# Patient Record
Sex: Female | Born: 1995 | Race: White | Hispanic: No | Marital: Single | Smoking: Never smoker
Health system: Southern US, Community
[De-identification: ages and names within clinical notes are randomized; demographics above are authoritative.]

## PROBLEM LIST (undated history)

## (undated) DIAGNOSIS — K219 Gastro-esophageal reflux disease without esophagitis: Secondary | ICD-10-CM

## (undated) DIAGNOSIS — E559 Vitamin D deficiency, unspecified: Secondary | ICD-10-CM

## (undated) DIAGNOSIS — N946 Dysmenorrhea, unspecified: Secondary | ICD-10-CM

## (undated) DIAGNOSIS — N1330 Unspecified hydronephrosis: Secondary | ICD-10-CM

## (undated) DIAGNOSIS — R519 Headache, unspecified: Secondary | ICD-10-CM

## (undated) DIAGNOSIS — M25539 Pain in unspecified wrist: Secondary | ICD-10-CM

## (undated) DIAGNOSIS — R109 Unspecified abdominal pain: Secondary | ICD-10-CM

## (undated) DIAGNOSIS — IMO0001 Reserved for inherently not codable concepts without codable children: Secondary | ICD-10-CM

## (undated) DIAGNOSIS — E785 Hyperlipidemia, unspecified: Secondary | ICD-10-CM

## (undated) DIAGNOSIS — R55 Syncope and collapse: Secondary | ICD-10-CM

## (undated) DIAGNOSIS — J45909 Unspecified asthma, uncomplicated: Secondary | ICD-10-CM

## (undated) DIAGNOSIS — L309 Dermatitis, unspecified: Secondary | ICD-10-CM

## (undated) DIAGNOSIS — L603 Nail dystrophy: Secondary | ICD-10-CM

## (undated) DIAGNOSIS — R81 Glycosuria: Secondary | ICD-10-CM

## (undated) DIAGNOSIS — J309 Allergic rhinitis, unspecified: Secondary | ICD-10-CM

## (undated) DIAGNOSIS — R51 Headache: Secondary | ICD-10-CM

## (undated) HISTORY — DX: Unspecified abdominal pain: R10.9

## (undated) HISTORY — DX: Headache, unspecified: R51.9

## (undated) HISTORY — DX: Dermatitis, unspecified: L30.9

## (undated) HISTORY — DX: Pain in unspecified wrist: M25.539

## (undated) HISTORY — DX: Allergic rhinitis, unspecified: J30.9

## (undated) HISTORY — PX: DIAGNOSTIC LAPAROSCOPY: SUR761

## (undated) HISTORY — DX: Dysmenorrhea, unspecified: N94.6

## (undated) HISTORY — DX: Hyperlipidemia, unspecified: E78.5

## (undated) HISTORY — DX: Unspecified asthma, uncomplicated: J45.909

## (undated) HISTORY — DX: Headache: R51

## (undated) HISTORY — DX: Nail dystrophy: L60.3

## (undated) HISTORY — DX: Reserved for inherently not codable concepts without codable children: IMO0001

## (undated) HISTORY — DX: Unspecified hydronephrosis: N13.30

## (undated) HISTORY — DX: Syncope and collapse: R55

## (undated) HISTORY — DX: Vitamin D deficiency, unspecified: E55.9

## (undated) HISTORY — DX: Gastro-esophageal reflux disease without esophagitis: K21.9

## (undated) HISTORY — PX: OTHER SURGICAL HISTORY: SHX169

---
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
## 2016-01-11 ENCOUNTER — Ambulatory Visit: Payer: Medicaid Other | Admitting: Obstetrics and Gynecology

## 2016-01-22 ENCOUNTER — Encounter: Payer: Self-pay | Admitting: Obstetrics and Gynecology

## 2016-01-22 ENCOUNTER — Ambulatory Visit (INDEPENDENT_AMBULATORY_CARE_PROVIDER_SITE_OTHER): Payer: Medicaid Other | Admitting: Obstetrics and Gynecology

## 2016-01-22 VITALS — BP 112/71 | HR 92 | Resp 16 | Ht 62.0 in | Wt 114.9 lb

## 2016-01-22 DIAGNOSIS — N390 Urinary tract infection, site not specified: Secondary | ICD-10-CM

## 2016-01-22 NOTE — Progress Notes (Signed)
9:34 AM   Robin Mccall 1996-04-25 [id number]  Referring provider: Evelene Croon, MD [address] [place], [place] [address]  Chief Complaint  Patient presents with  . Recurrent UTI    HPI: Patient is an 20 year old female presenting today as a referral from her primary care provider's office for recurrent urinary tract infections and glycosuria.    She states that she was recently seen by her GYN provider and was diagnosed with a vaginal yeast infection. She has since completed treatment with improvement in dysuria symptoms. She denies any flank pain, fevers, gross hematuria, frequency or urgency. He has no history of kidney stones.  Patient states that she has been told that of her recent urine dips have been positive for glucose. She states that her primary care provider performed blood work and told her that she was not diabetic but that she was unsure why she had glucose in her urine.  She does report discomfort with intercourse. She states that she did not have any of these symptoms prior to beginning to have intercourse. She reports vaginal irritation and dryness. She states that it may be related to condom usage.  Daily fluid intake includes one soda, 1-2 glasses of milk and one bottle of water daily.   10/23/15 Urine Culture- no growth Urine dip positive for +2RBCs, small glucose, trace protein and trace ketones  Abdominal US negative for GU pathology 10/04/16.   Interval History 01/22/16 Reports intermittent dysuria.  No gross hematuria.  She has been drinking more water. She was seen by nephrology and has a follow up scheduled.  She reports that she is undergoing testing for her heart by her PCP and that they are concerned about her kidneys.  She states that she should be notified of results in 3 weeks.    PMH: Past Medical History  Diagnosis Date  . Asthma   . Allergic rhinitis   . Dermatitis   . Dysmenorrhea   . Reflux   . HLD (hyperlipidemia)   . Wrist pain   .  Syncope   . Abdominal pain   . Vitamin D deficiency   . Hydronephrosis   . 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 HA (headache)     migraines-related to stress  . Glucose found in urine on examination     seeing nephrologist on 12-07-15-pt unsure of what the MD's name is     Surgical History: Past Surgical History  Procedure Laterality Date  . Laporoscopic abdomen    . Diagnostic laparoscopy    . Ganglion cyst excision Left 12/14/2015    Procedure: REMOVAL GANGLION OF WRIST;  Surgeon: Deeann Saint, MD;  Location: [facility] ORS;  Service: Orthopedics;  Laterality: Left;    Home Medications:    Medication List       This list is accurate as of: 01/22/16  9:34 AM.  Always use your most recent med list.               albuterol 108 (90 Base) MCG/ACT inhaler  Commonly known as:  PROVENTIL HFA;VENTOLIN HFA  Inhale 2 puffs into the lungs every 6 (six) hours as needed for wheezing or shortness of breath.     dicyclomine 10 MG capsule  Commonly known as:  BENTYL  Take 10 mg by mouth as needed.     multivitamin with minerals tablet  Take 1 tablet by mouth 3 (three) times daily. Reported on 01/22/2016     NIGHTTIME SLEEP AID PO  Take 1 tablet by mouth at  bedtime as needed.     TRI-SPRINTEC 0.18/0.215/0.25 MG-35 MCG tablet  Generic drug:  Norgestimate-Ethinyl Estradiol Triphasic  Take 1 tablet by mouth daily. Reported on 11/11/2015        Allergies: No Known Allergies  Family History: Family History  Problem Relation Age of Onset  . Stroke    . Heart disease    . Cancer      Social History:  reports that she has never smoked. She does not have any smokeless tobacco history on file. She reports that she does not drink alcohol or use illicit drugs.  ROS: UROLOGY Frequent Urination?: No Hard to postpone urination?: No Burning/pain with urination?: Yes Get up at night to urinate?: No Leakage of urine?: No Urine stream starts and stops?: No Trouble starting stream?: No Do you have  to strain to urinate?: No Blood in urine?: No Urinary tract infection?: Yes Sexually transmitted disease?: No Injury to kidneys or bladder?: No Painful intercourse?: Yes Weak stream?: No Currently pregnant?: No Vaginal bleeding?: No Last menstrual period?: n  Gastrointestinal Nausea?: No Vomiting?: No Indigestion/heartburn?: No Diarrhea?: No Constipation?: No  Constitutional Fever: No Night sweats?: No Weight loss?: No Fatigue?: No  Skin Skin rash/lesions?: No Itching?: No  Eyes Blurred vision?: No Double vision?: No  Ears/Nose/Throat Sore throat?: No Sinus problems?: No  Hematologic/Lymphatic Swollen glands?: No Easy bruising?: No  Cardiovascular Leg swelling?: No Chest pain?: No  Respiratory Cough?: No Shortness of breath?: No  Endocrine Excessive thirst?: No  Musculoskeletal Back pain?: Yes Joint pain?: No  Neurological Headaches?: No Dizziness?: No  Psychologic Depression?: No Anxiety?: No  Physical Exam: BP 112/71 mmHg  Pulse 92  Resp 16  Ht 5\' 2"  (1.575 m)  Wt 114 lb 14.4 oz (52.118 kg)  BMI 21.01 kg/m2  Constitutional:  Alert and oriented, No acute distress. HEENT: [place] AT, moist mucus membranes.  Trachea midline, no masses. GU: No CVA tenderness.  Skin: No rashes, bruises or suspicious lesions. Neurologic: Grossly intact, no focal deficits, moving all 4 extremities. Psychiatric: Normal mood and affect.  Laboratory Data:   Urinalysis Results for orders placed or performed during the hospital encounter of 12/14/15  Pregnancy, urine POC  Result Value Ref Range   Preg Test, Ur Negative Negative  Pregnancy, urine POC  Result Value Ref Range   Preg Test, Ur NEGATIVE NEGATIVE    Pertinent Imaging:   Assessment & Plan:    1. Recurrent UTI- Continued intermittent dysuria. - GC/Chlamydia/Trichomonas- negative - Urinalysis, Complete  2. Microscopic Hematuria- 3-10 RBCs seen today.  Sent for culture to r/o infection as  cause.  Negative abdominal US 09/2015.  Will further assess once work up complete by PCP and nephrology.  May need cystoscopy in the future.   2. Dysparuenia- Recommended hypoallergenic vaginal lubricants and increased hydration.   3. Glycosuria- Followed by [facility].  Return in about 3 weeks (around 02/12/2016) for recheck [facility] with [facility].  These notes generated with voice recognition software. I apologize for typographical errors.  Earlie LouLindsay Rorey Bisson, FNP  [facility] [address], [address] [place], [place] [address] [phone]

## 2016-01-23 LAB — MICROSCOPIC EXAMINATION

## 2016-01-23 LAB — URINALYSIS, COMPLETE
BILIRUBIN UA: NEGATIVE
KETONES UA: NEGATIVE
Leukocytes, UA: NEGATIVE
NITRITE UA: NEGATIVE
Protein, UA: NEGATIVE
SPEC GRAV UA: 1.025 (ref 1.005–1.030)
UUROB: 0.2 mg/dL (ref 0.2–1.0)
pH, UA: 5.5 (ref 5.0–7.5)

## 2016-01-24 LAB — CULTURE, URINE COMPREHENSIVE

## 2016-02-12 ENCOUNTER — Ambulatory Visit (INDEPENDENT_AMBULATORY_CARE_PROVIDER_SITE_OTHER): Payer: Medicaid Other | Admitting: Urology

## 2016-02-12 ENCOUNTER — Encounter: Payer: Self-pay | Admitting: Urology

## 2016-02-12 VITALS — BP 97/64 | HR 82 | Ht 63.0 in | Wt 114.6 lb

## 2016-02-12 DIAGNOSIS — N390 Urinary tract infection, site not specified: Secondary | ICD-10-CM | POA: Diagnosis not present

## 2016-02-12 DIAGNOSIS — R3129 Other microscopic hematuria: Secondary | ICD-10-CM | POA: Insufficient documentation

## 2016-02-12 DIAGNOSIS — R81 Glycosuria: Secondary | ICD-10-CM | POA: Insufficient documentation

## 2016-02-12 DIAGNOSIS — N941 Unspecified dyspareunia: Secondary | ICD-10-CM

## 2016-02-12 LAB — URINALYSIS, COMPLETE
Bilirubin, UA: NEGATIVE
GLUCOSE, UA: NEGATIVE
Ketones, UA: NEGATIVE
Leukocytes, UA: NEGATIVE
NITRITE UA: NEGATIVE
PH UA: 5.5 (ref 5.0–7.5)
Protein, UA: NEGATIVE
Specific Gravity, UA: 1.025 (ref 1.005–1.030)
Urobilinogen, Ur: 0.2 mg/dL (ref 0.2–1.0)

## 2016-02-12 LAB — MICROSCOPIC EXAMINATION
Epithelial Cells (non renal): >10 /hpf — AB (ref 0–10)
WBC, UA: NONE SEEN /hpf (ref 0–?)

## 2016-02-12 NOTE — Progress Notes (Signed)
02/12/2016 10:19 PM   Robin Mccall 1996-08-02 [id number]  Referring provider: Evelene Croon, MD [place] [address] [place], [place] [address]  Chief Complaint  Patient presents with  . Recurrent UTI    3 week follow up    HPI: Patient is an 20 year old Caucasian female who is currently being worked up for glucose dysuria and microscopic hematuria with nephrology and her primary care physician.  Patient states that she suffers dyspareunia, recurrent urinary tract infections and dysuria.  I do not have any documented infections.  Renal ultrasound performed on 10/05/2015 noted no abnormalities. Her UA today is negative for glucose, but has 3-10 rbc's per high-power field.  She experiences pain during intercourse upon penetration and thrusting.  Her GC/chlamydia antra cultures have been negative.  Urine culture performed on 01/22/2016 was negative for infection.     PMH: Past Medical History  Diagnosis Date  . Asthma   . Allergic rhinitis   . Dermatitis   . Dysmenorrhea   . Reflux   . HLD (hyperlipidemia)   . Wrist pain   . Syncope   . Abdominal pain   . Vitamin D deficiency   . Hydronephrosis   . 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 HA (headache)     migraines-related to stress  . Glucose found in urine on examination     seeing nephrologist on 12-07-15-pt unsure of what the MD's name is     Surgical History: Past Surgical History  Procedure Laterality Date  . Laporoscopic abdomen    . Diagnostic laparoscopy    . Ganglion cyst excision Left 12/14/2015    Procedure: REMOVAL GANGLION OF WRIST;  Surgeon: Deeann Saint, MD;  Location: [facility] ORS;  Service: Orthopedics;  Laterality: Left;    Home Medications:    Medication List       This list is accurate as of: 02/12/16 10:19 PM.  Always use your most recent med list.               albuterol 108 (90 Base) MCG/ACT inhaler  Commonly known as:  PROVENTIL HFA;VENTOLIN HFA  Inhale 2 puffs into the lungs every 6 (six) hours as  needed for wheezing or shortness of breath.     CLARITIN 10 MG tablet  Generic drug:  loratadine  Take 10 mg by mouth daily.     dicyclomine 10 MG capsule  Commonly known as:  BENTYL  Take 10 mg by mouth as needed.     multivitamin with minerals tablet  Take 1 tablet by mouth 3 (three) times daily. Reported on 02/12/2016     NIGHTTIME SLEEP AID PO  Take 1 tablet by mouth at bedtime as needed.     TRI-SPRINTEC 0.18/0.215/0.25 MG-35 MCG tablet  Generic drug:  Norgestimate-Ethinyl Estradiol Triphasic  Take 1 tablet by mouth daily. Reported on 11/11/2015        Allergies: No Known Allergies  Family History: Family History  Problem Relation Age of Onset  . Stroke    . Heart disease    . Cancer    . Kidney disease Neg Hx   . Bladder Cancer Neg Hx     Social History:  reports that she has never smoked. She does not have any smokeless tobacco history on file. She reports that she does not drink alcohol or use illicit drugs.  ROS: UROLOGY Frequent Urination?: No Hard to postpone urination?: No Burning/pain with urination?: Yes Get up at night to urinate?: Yes Leakage of urine?: No Urine stream starts  and stops?: No Trouble starting stream?: No Do you have to strain to urinate?: No Blood in urine?: No Urinary tract infection?: Yes Sexually transmitted disease?: No Injury to kidneys or bladder?: No Painful intercourse?: Yes Weak stream?: No Currently pregnant?: No Vaginal bleeding?: No Last menstrual period?: n  Gastrointestinal Nausea?: No Vomiting?: No Indigestion/heartburn?: No Diarrhea?: No Constipation?: No  Constitutional Fever: No Night sweats?: No Weight loss?: No Fatigue?: No  Skin Skin rash/lesions?: No Itching?: No  Eyes Blurred vision?: No Double vision?: No  Ears/Nose/Throat Sore throat?: No Sinus problems?: No  Hematologic/Lymphatic Swollen glands?: No Easy bruising?: No  Cardiovascular Leg swelling?: No Chest pain?:  No  Respiratory Cough?: No Shortness of breath?: No  Endocrine Excessive thirst?: No  Musculoskeletal Back pain?: No Joint pain?: No  Neurological Headaches?: No Dizziness?: No  Psychologic Depression?: No Anxiety?: No  Physical Exam: BP 97/64 mmHg  Pulse 82  Ht 5\' 3"  (1.6 m)  Wt 114 lb 9.6 oz (51.982 kg)  BMI 20.31 kg/m2  LMP 01/20/2016  Constitutional: Well nourished. Alert and oriented, No acute distress. HEENT: [place] AT, moist mucus membranes. Trachea midline, no masses. Cardiovascular: No clubbing, cyanosis, or edema. Respiratory: Normal respiratory effort, no increased work of breathing. Skin: No rashes, bruises or suspicious lesions. Lymph: No cervical or inguinal adenopathy. Neurologic: Grossly intact, no focal deficits, moving all 4 extremities. Psychiatric: Normal mood and affect.  Laboratory Data: Lab Results  Component Value Date   WBC 4.9 09/16/2014   HGB 13.5 09/16/2014   HCT 40.2 09/16/2014   MCV 89 09/16/2014   PLT 219 09/16/2014    Lab Results  Component Value Date   CREATININE 0.70 09/16/2014    Lab Results  Component Value Date   AST 21 09/16/2014   Lab Results  Component Value Date   ALT 18 09/16/2014     Urinalysis Results for orders placed or performed in visit on 02/12/16  Microscopic Examination  Result Value Ref Range   WBC, UA None seen 0 -  5 /hpf   RBC, UA 3-10 (A) 0 -  2 /hpf   Epithelial Cells (non renal) >10 (A) 0 - 10 /hpf   Bacteria, UA Few None seen/Few  Urinalysis, Complete  Result Value Ref Range   Specific Gravity, UA 1.025 1.005 - 1.030   pH, UA 5.5 5.0 - 7.5   Color, UA Yellow Yellow   Appearance Ur Cloudy (A) Clear   Leukocytes, UA Negative Negative   Protein, UA Negative Negative/Trace   Glucose, UA Negative Negative   Ketones, UA Negative Negative   RBC, UA 1+ (A) Negative   Bilirubin, UA Negative Negative   Urobilinogen, Ur 0.2 0.2 - 1.0 mg/dL   Nitrite, UA Negative Negative   Microscopic  Examination See below:     Pertinent Imaging: CLINICAL DATA: Abdominal pain.  EXAM: ULTRASOUND ABDOMEN COMPLETE  COMPARISON: No prior exams .  FINDINGS: Gallbladder: Gallbladder is mildly contracted. No gallstones or wall thickening visualized. No sonographic Murphy sign noted.  Common bile duct: Diameter: 1.7 mm.  Liver: No focal lesion identified. Within normal limits in parenchymal echogenicity.  IVC: No abnormality visualized.  Pancreas: Visualized portion unremarkable.  Spleen: Size and appearance within normal limits.  Right Kidney: Length: 9.9 cm. Echogenicity within normal limits. No mass or hydronephrosis visualized.  Left Kidney: Length: 10.5 cm. Echogenicity within normal limits. No mass or hydronephrosis visualized.  Abdominal aorta: No aneurysm visualized.  Other findings: None.  IMPRESSION: Negative exam.   Electronically Signed  ByMaisie Fus Register  On: 10/05/2015 07:58        Assessment & Plan:   1. Recurrent UTI:   At this time, we do not have documented infections.  We'll continue to monitor.  - Urinalysis, Complete  2. Glucosuria:   Currently being evaluated by nephrology and PCP.  3. Dyspareunia:  Patient needed to cut office visit short due to having to take a test. She will return once her PCP has finished their evaluation.  4. Microscopic hematuria:   Patient needed to cut office visit short due to having to take a test. She will return once her PCP has finished their evaluation.   Return for Return once receive PCPs notes.  These notes generated with voice recognition software. I apologize for typographical errors.  Michiel Cowboy, PA-C  [facility] [address] [place], [place] [address] [phone]
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
## 2017-02-04 ENCOUNTER — Emergency Department
Admission: EM | Admit: 2017-02-04 | Discharge: 2017-02-04 | Disposition: A | Discharge status: Home / Self Care | Payer: Medicaid Other | Attending: Emergency Medicine | Admitting: Emergency Medicine

## 2017-02-04 ENCOUNTER — Encounter: Payer: Self-pay | Admitting: Emergency Medicine

## 2017-02-04 DIAGNOSIS — L03031 Cellulitis of right toe: Secondary | ICD-10-CM | POA: Insufficient documentation

## 2017-02-04 DIAGNOSIS — J45909 Unspecified asthma, uncomplicated: Secondary | ICD-10-CM | POA: Insufficient documentation

## 2017-02-04 MED ORDER — CEPHALEXIN 500 MG PO CAPS: 500.0000 mg | ORAL_CAPSULE | Freq: Four times a day (QID) | ORAL | 0 refills | Status: AC

## 2017-02-04 NOTE — ED Notes (Signed)
See triage note  States she wore a new pain of shoe about 5 days ago  Now has swelling and blister area to right great toe
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
## 2017-02-04 NOTE — ED Triage Notes (Signed)
C/O left great toe pain and swelling x 5 days.

## 2017-03-28 ENCOUNTER — Ambulatory Visit (INDEPENDENT_AMBULATORY_CARE_PROVIDER_SITE_OTHER): Payer: 59 | Admitting: Obstetrics and Gynecology

## 2017-03-28 ENCOUNTER — Encounter: Payer: Self-pay | Admitting: Obstetrics and Gynecology

## 2017-03-28 VITALS — BP 108/64 | Ht 63.0 in | Wt 115.0 lb

## 2017-03-28 DIAGNOSIS — N3001 Acute cystitis with hematuria: Secondary | ICD-10-CM | POA: Diagnosis not present

## 2017-03-28 LAB — POCT URINALYSIS DIPSTICK
BILIRUBIN UA: NEGATIVE
GLUCOSE UA: NEGATIVE
Ketones, UA: NEGATIVE
Nitrite, UA: NEGATIVE
PH UA: 6 (ref 5.0–8.0)
Spec Grav, UA: 1.015 (ref 1.010–1.025)

## 2017-03-28 MED ORDER — NITROFURANTOIN MONOHYD MACRO 100 MG PO CAPS: 100.0000 mg | ORAL_CAPSULE | Freq: Two times a day (BID) | ORAL | 0 refills | Status: AC

## 2017-03-28 NOTE — Progress Notes (Signed)
Chief Complaint  Patient presents with  . Urinary Tract Infection    HPI:      [name] is a 21 y.o. G0P0000 who LMP was Patient's last menstrual period was 03/22/2017., presents today for UTI sx. She has noticed dysuria, hematuria, urinary frequency, urgency for the past 3 days. No LBP, fevers, vag sx. She has a hx of recurrent UTIs and saw urology in 2017 without etiology determined. She hasn't had a UTI recently.      Patient Active Problem List   Diagnosis Date Noted  . Microscopic hematuria 02/12/2016  . 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 Dyspareunia in female 02/12/2016  . 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 Stroke Unknown   . Heart disease Unknown   . Cancer Unknown   . Kidney disease Neg Hx   . Bladder Cancer Neg Hx     Social History   Social History  . Marital status: Single    Spouse name: N/A  . Number of children: N/A  . Years of education: N/A   Occupational History  . Not on file.   Social History Main Topics  . Smoking status: Never Smoker  . Smokeless tobacco: Never Used  . Alcohol use No  . Drug use: No  . Sexual activity: Yes    Birth control/ protection: Pill   Other Topics Concern  . Not on file   Social History Narrative  . No narrative on file     Current Outpatient Prescriptions:  .  Norgestimate-Ethinyl Estradiol Triphasic (TRI-SPRINTEC) 0.18/0.215/0.25 MG-35 MCG tablet, Take 1 tablet by mouth daily. Reported on 11/11/2015, Disp: , Rfl:  .  albuterol (PROVENTIL HFA;VENTOLIN HFA) 108 (90 Base) MCG/ACT inhaler, Inhale 2 puffs into the lungs every 6 (six) hours as needed for wheezing or shortness of breath., Disp: , Rfl:  .  DiphenhydrAMINE HCl, Sleep, (NIGHTTIME SLEEP AID PO), Take 1 tablet by mouth at bedtime as needed., Disp: , Rfl:  .  nitrofurantoin, macrocrystal-monohydrate, (MACROBID) 100 MG capsule, Take 1 capsule (100 mg total) by mouth 2 (two) times daily., Disp: 10 capsule, Rfl:  0  Review of Systems  Constitutional: Negative for fever.  Gastrointestinal: Negative for blood in stool, constipation, diarrhea, nausea and vomiting.  Genitourinary: Positive for dysuria, frequency, hematuria and urgency. Negative for dyspareunia, flank pain, vaginal bleeding, vaginal discharge and vaginal pain.  Musculoskeletal: Negative for back pain.  Skin: Negative for rash.     OBJECTIVE:   Vitals:  BP 108/64   Ht 5\' 3"  (1.6 m)   Wt 115 lb (52.2 kg)   LMP 03/22/2017   BMI 20.37 kg/m   Physical Exam  Constitutional: She is well-developed, well-nourished, and in no distress.  Abdominal: There is no CVA tenderness.  Vitals reviewed.   Results: Results for orders placed or performed in visit on 03/28/17 (from the past 24 hour(s))  POCT Urinalysis Dipstick     Status: Abnormal   Collection Time: 03/28/17 12:06 PM  Result Value Ref Range   Color, UA yellow    Clarity, UA     Glucose, UA neg    Bilirubin, UA neg    Ketones, UA neg    Spec Grav, UA 1.015 1.010 - 1.025   Blood, UA mod    pH, UA 6.0 5.0 - 8.0   Protein, UA 2+    Urobilinogen, UA  0.2 or 1.0 E.U./dL   Nitrite, UA neg    Leukocytes, UA  Trace (A) Negative     Assessment/Plan: Acute cystitis with hematuria - Rx macrobid. F/u prn.  - Plan: POCT Urinalysis Dipstick, Urine culture, nitrofurantoin, macrocrystal-monohydrate, (MACROBID) 100 MG capsule     Meds ordered this encounter  Medications  . nitrofurantoin, macrocrystal-monohydrate, (MACROBID) 100 MG capsule    Sig: Take 1 capsule (100 mg total) by mouth 2 (two) times daily.    Dispense:  10 capsule    Refill:  0      Return if symptoms worsen or fail to improve.  Alicia B. Copland, PA-C 03/28/2017 12:07 PM

## 2017-03-30 LAB — URINE CULTURE

## 2018-03-18 ENCOUNTER — Encounter: Payer: Self-pay | Admitting: Emergency Medicine

## 2018-03-18 ENCOUNTER — Emergency Department
Admission: EM | Admit: 2018-03-18 | Discharge: 2018-03-18 | Disposition: A | Discharge status: Home / Self Care | Payer: 59 | Attending: Emergency Medicine | Admitting: Emergency Medicine

## 2018-03-18 ENCOUNTER — Other Ambulatory Visit: Payer: Self-pay

## 2018-03-18 DIAGNOSIS — R1013 Epigastric pain: Secondary | ICD-10-CM

## 2018-03-18 DIAGNOSIS — Z79899 Other long term (current) drug therapy: Secondary | ICD-10-CM | POA: Diagnosis not present

## 2018-03-18 DIAGNOSIS — J45909 Unspecified asthma, uncomplicated: Secondary | ICD-10-CM | POA: Insufficient documentation

## 2018-03-18 DIAGNOSIS — G43809 Other migraine, not intractable, without status migrainosus: Secondary | ICD-10-CM

## 2018-03-18 DIAGNOSIS — R112 Nausea with vomiting, unspecified: Secondary | ICD-10-CM | POA: Insufficient documentation

## 2018-03-18 LAB — COMPREHENSIVE METABOLIC PANEL
ALBUMIN: 3.7 g/dL (ref 3.5–5.0)
ALT: 17 U/L (ref 14–54)
AST: 22 U/L (ref 15–41)
Alkaline Phosphatase: 63 U/L (ref 38–126)
Anion gap: 5 (ref 5–15)
BILIRUBIN TOTAL: 0.2 mg/dL — AB (ref 0.3–1.2)
BUN: 13 mg/dL (ref 6–20)
CHLORIDE: 106 mmol/L (ref 101–111)
CO2: 25 mmol/L (ref 22–32)
CREATININE: 0.45 mg/dL (ref 0.44–1.00)
Calcium: 8.3 mg/dL — ABNORMAL LOW (ref 8.9–10.3)
GFR calc Af Amer: >60 mL/min (ref >60)
GFR calc non Af Amer: >60 mL/min (ref >60)
Glucose, Bld: 126 mg/dL — ABNORMAL HIGH (ref 65–99)
POTASSIUM: 3.4 mmol/L — AB (ref 3.5–5.1)
Sodium: 136 mmol/L (ref 135–145)
TOTAL PROTEIN: 7 g/dL (ref 6.5–8.1)

## 2018-03-18 LAB — CBC
HEMATOCRIT: 40.2 % (ref 35.0–47.0)
Hemoglobin: 13.8 g/dL (ref 12.0–16.0)
MCH: 30.1 pg (ref 26.0–34.0)
MCHC: 34.3 g/dL (ref 32.0–36.0)
MCV: 87.8 fL (ref 80.0–100.0)
Platelets: 250 10*3/uL (ref 150–440)
RBC: 4.58 MIL/uL (ref 3.80–5.20)
RDW: 13.1 % (ref 11.5–14.5)
WBC: 5.9 10*3/uL (ref 3.6–11.0)

## 2018-03-18 LAB — HCG, QUANTITATIVE, PREGNANCY: hCG, Beta Chain, Quant, S: <1 m[IU]/mL (ref <5)

## 2018-03-18 LAB — LIPASE, BLOOD: Lipase: 32 U/L (ref 11–51)

## 2018-03-18 MED ORDER — PROCHLORPERAZINE EDISYLATE 10 MG/2ML IJ SOLN
10.0000 mg | Freq: Once | INTRAMUSCULAR | Status: AC
  Administered 2018-03-18: 10 mg via INTRAVENOUS
  Filled 2018-03-18: qty 2

## 2018-03-18 MED ORDER — DIPHENHYDRAMINE HCL 50 MG/ML IJ SOLN
25.0000 mg | Freq: Once | INTRAMUSCULAR | Status: AC
  Administered 2018-03-18: 25 mg via INTRAVENOUS
  Filled 2018-03-18: qty 1

## 2018-03-18 MED ORDER — SODIUM CHLORIDE 0.9 % IV BOLUS
1000.0000 mL | Freq: Once | INTRAVENOUS | Status: AC
  Administered 2018-03-18: 1000 mL via INTRAVENOUS

## 2018-03-18 MED ORDER — ONDANSETRON HCL 4 MG/2ML IJ SOLN: 4.0000 mg | Freq: Once | INTRAMUSCULAR | Status: DC | PRN

## 2018-03-18 NOTE — ED Triage Notes (Addendum)
Pt reports upper abd pain sing Friday night with vomiting since Saturday morning; pt says after eating or drinking anything she vomits; tonight she started with a migraine to frontal area; pt also reports "brown" when she wipes after voiding;

## 2018-03-18 NOTE — ED Notes (Signed)

## 2018-03-18 NOTE — ED Provider Notes (Signed)
[facility] Emergency Department Provider Note  ____________________________________________   First MD Initiated Contact with Patient 03/18/18 0136     (approximate)  I have reviewed the triage vital signs and the nursing notes.   HISTORY  Chief Complaint Migraine and Abdominal Pain   HPI Robin Mccall is a 22 y.o. female who self presents to the emergency department with about 2 days of diffuse cramping epigastric pain associated with nausea and vomiting.  No diarrhea.  No fevers or chills.  No dysuria frequency or hesitancy.  No back pain.  Pain is moderate severity cramping mostly constant.  Seems to be somewhat worse when trying to eat and somewhat improved when not.  She has developed a gradual onset not maximal onset frontal throbbing headache identical to previous migraine headaches.  Her headache was severe this evening but she took 2 Fioricet which did calm it down somewhat.  Her only abdominal surgery was a laparoscopic evaluation for endometriosis with no actual surgery performed.  Past Medical History:  Diagnosis Date  . Abdominal pain   . Allergic rhinitis   . Asthma   . Dermatitis   . Dysmenorrhea   . Glucose found in urine on examination    seeing nephrologist on 12-07-15-pt unsure of what the MD's name is   . HA (headache)    migraines-related to stress  . HLD (hyperlipidemia)   . Hydronephrosis   . 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 Reflux   . Syncope   . Vitamin D deficiency   . Wrist pain     Patient Active Problem List   Diagnosis Date Noted  . Microscopic hematuria 02/12/2016  . 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 Dyspareunia in female 02/12/2016  . 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 DIAGNOSTIC LAPAROSCOPY    . GANGLION CYST EXCISION Left 12/14/2015   Procedure: REMOVAL GANGLION OF WRIST;  Surgeon: Deeann Saint, MD;  Location: [facility] ORS;  Service: Orthopedics;  Laterality: Left;  .  laporoscopic abdomen      Prior to Admission medications   Medication Sig Start Date End Date Taking? Authorizing Provider  albuterol (PROVENTIL HFA;VENTOLIN HFA) 108 (90 Base) MCG/ACT inhaler Inhale 2 puffs into the lungs every 6 (six) hours as needed for wheezing or shortness of breath.   Yes [provider]  butalbital-acetaminophen-caffeine (FIORICET, ESGIC) 50-325-40 MG tablet Take 1 tablet by mouth 3 (three) times daily as needed for headache.   Yes [provider]  Norgestimate-Ethinyl Estradiol Triphasic (TRI-SPRINTEC) 0.18/0.215/0.25 MG-35 MCG tablet Take 1 tablet by mouth daily. Reported on 11/11/2015   Yes [provider]    Allergies Patient has no known allergies.  Family History  Problem Relation Age of Onset  . Stroke Unknown   . Heart disease Unknown   . Cancer Unknown   . Kidney disease Neg Hx   . Bladder Cancer Neg Hx     Social History Social History   Tobacco Use  . Smoking status: Never Smoker  . Smokeless tobacco: Never Used  Substance Use Topics  . Alcohol use: No  . Drug use: No    Review of Systems Constitutional: No fever/chills Eyes: No visual changes. ENT: No sore throat. Cardiovascular: Denies chest pain. Respiratory: Denies shortness of breath. Gastrointestinal: Positive for abdominal pain.  Positive for nausea, positive for vomiting.  No diarrhea.  No constipation. Genitourinary: Negative for dysuria. Musculoskeletal: Negative for back pain. Skin: Negative for rash. Neurological: Positive for headache  ____________________________________________  PHYSICAL EXAM:  VITAL SIGNS: ED Triage Vitals  Enc Vitals Group     BP 03/18/18 0131 133/90     Pulse Rate 03/18/18 0131 91     Resp 03/18/18 0131 17     Temp 03/18/18 0131 98.2 F (36.8 C)     Temp Source 03/18/18 0131 Oral     SpO2 03/18/18 0131 99 %     Weight 03/18/18 0132 120 lb (54.4 kg)     Height 03/18/18 0132  (1.6 m)     Head Circumference  --      Peak Flow --      Pain Score 03/18/18 0132 6     Pain Loc --      Pain Edu? --      Excl. in GC? --     Constitutional: Alert and oriented x4 appears somewhat uncomfortable nontoxic no diaphoresis speaks in full clear sentences Eyes: PERRL EOMI. midrange and brisk Head: Atraumatic. Nose: No congestion/rhinnorhea. Mouth/Throat: No trismus Neck: No stridor.   Cardiovascular: Normal rate, regular rhythm. Grossly normal heart sounds.  Good peripheral circulation. Respiratory: Normal respiratory effort.  No retractions. Lungs CTAB and moving good air Gastrointestinal: Soft nondistended mild diffuse tenderness over with some discomfort with rebound.  No McBurney's tenderness negative Rovsing's no costovertebral tenderness Musculoskeletal: No lower extremity edema   Neurologic:  Normal speech and language. No gross focal neurologic deficits are appreciated. Skin:  Skin is warm, dry and intact. No rash noted. Psychiatric: Mood and affect are normal. Speech and behavior are normal.    ____________________________________________   DIFFERENTIAL includes but not limited to  Appendicitis, ectopic pregnancy, urinary tract infection, pyelonephritis, migraine headache, dehydration ____________________________________________   LABS (all labs ordered are listed, but only abnormal results are displayed)  Labs Reviewed  COMPREHENSIVE METABOLIC PANEL - Abnormal; Notable for the following components:      Result Value   Potassium 3.4 (*)    Glucose, Bld 126 (*)    Calcium 8.3 (*)    Total Bilirubin 0.2 (*)    All other components within normal limits  LIPASE, BLOOD  CBC  HCG, QUANTITATIVE, PREGNANCY  URINALYSIS, COMPLETE (UACMP) WITH MICROSCOPIC  POC URINE PREG, ED    Lab work reviewed by me with no acute disease.  The patient is not  pregnant. __________________________________________  EKG   ____________________________________________  RADIOLOGY   ____________________________________________   PROCEDURES  Procedure(s) performed: no  Procedures  Critical Care performed: no  Observation: no ____________________________________________   INITIAL IMPRESSION / ASSESSMENT AND PLAN / ED COURSE  Pertinent labs & imaging results that were available during my care of the patient were reviewed by me and considered in my medical decision making (see chart for details).  The patient arrives primarily with migraine but also some abdominal pain.  Her abdominal exam is benign.  Compazine and Benadryl along with fluids and labs are pending.  No imaging at this time.     ----------------------------------------- 2:51 AM on 03/18/2018 -----------------------------------------  The patient's pain is resolved and she would like to go home.  She is able to eat and drink without difficulty.  No indication for imaging of her abdomen.  We will give her 24-hour recheck with her primary care physician.  Discharged home in improved condition she verbalizes understanding agreement plan.  ____________________________________________   FINAL CLINICAL IMPRESSION(S) / ED DIAGNOSES  Final diagnoses:  Other migraine without status migrainosus, not intractable  Epigastric pain      NEW MEDICATIONS STARTED DURING  THIS VISIT:  Discharge Medication List as of 03/18/2018  2:51 AM       Note:  This document was prepared using Dragon voice recognition software and may include unintentional dictation errors.     Merrily Brittle, MD 03/18/18 [phone]

## 2018-03-18 NOTE — Discharge Instructions (Signed)
It was a pleasure to take care of you today, and thank you for coming to our emergency department.  If you have any questions or concerns before leaving please ask the nurse to grab me and I'm more than happy to go through your aftercare instructions again.  If you were prescribed any opioid pain medication today such as Norco, Vicodin, Percocet, morphine, hydrocodone, or oxycodone please make sure you do not drive when you are taking this medication as it can alter your ability to drive safely.  If you have any concerns once you are home that you are not improving or are in fact getting worse before you can make it to your follow-up appointment, please do not hesitate to call 911 and come back for further evaluation.  Merrily Brittle, MD  Results for orders placed or performed during the hospital encounter of 03/18/18  Lipase, blood  Result Value Ref Range   Lipase 32 11 - 51 U/L  Comprehensive metabolic panel  Result Value Ref Range   Sodium 136 135 - 145 mmol/L   Potassium 3.4 (L) 3.5 - 5.1 mmol/L   Chloride 106 101 - 111 mmol/L   CO2 25 22 - 32 mmol/L   Glucose, Bld 126 (H) 65 - 99 mg/dL   BUN 13 6 - 20 mg/dL   Creatinine, Ser 4.09 0.44 - 1.00 mg/dL   Calcium 8.3 (L) 8.9 - 10.3 mg/dL   Total Protein 7.0 6.5 - 8.1 g/dL   Albumin 3.7 3.5 - 5.0 g/dL   AST 22 15 - 41 U/L   ALT 17 14 - 54 U/L   Alkaline Phosphatase 63 38 - 126 U/L   Total Bilirubin 0.2 (L) 0.3 - 1.2 mg/dL   GFR calc non Af Amer >60 >60 mL/min   GFR calc Af Amer >60 >60 mL/min   Anion gap 5 5 - 15  CBC  Result Value Ref Range   WBC 5.9 3.6 - 11.0 K/uL   RBC 4.58 3.80 - 5.20 MIL/uL   Hemoglobin 13.8 12.0 - 16.0 g/dL   HCT 81.1 91.4 - 78.2 %   MCV 87.8 80.0 - 100.0 fL   MCH 30.1 26.0 - 34.0 pg   MCHC 34.3 32.0 - 36.0 g/dL   RDW 95.6 21.3 - 08.6 %   Platelets 250 150 - 440 K/uL

## 2018-07-13 ENCOUNTER — Emergency Department
Admission: EM | Admit: 2018-07-13 | Discharge: 2018-07-13 | Disposition: A | Discharge status: Home / Self Care | Payer: 59 | Attending: Emergency Medicine | Admitting: Emergency Medicine

## 2018-07-13 ENCOUNTER — Emergency Department: Payer: 59

## 2018-07-13 DIAGNOSIS — R1011 Right upper quadrant pain: Secondary | ICD-10-CM | POA: Insufficient documentation

## 2018-07-13 DIAGNOSIS — J45909 Unspecified asthma, uncomplicated: Secondary | ICD-10-CM | POA: Diagnosis not present

## 2018-07-13 DIAGNOSIS — K2900 Acute gastritis without bleeding: Secondary | ICD-10-CM | POA: Insufficient documentation

## 2018-07-13 DIAGNOSIS — Z79899 Other long term (current) drug therapy: Secondary | ICD-10-CM | POA: Insufficient documentation

## 2018-07-13 LAB — COMPREHENSIVE METABOLIC PANEL
ALT: 24 U/L (ref 0–44)
ANION GAP: 5 (ref 5–15)
AST: 24 U/L (ref 15–41)
Albumin: 3.9 g/dL (ref 3.5–5.0)
Alkaline Phosphatase: 50 U/L (ref 38–126)
BILIRUBIN TOTAL: 0.6 mg/dL (ref 0.3–1.2)
BUN: 13 mg/dL (ref 6–20)
CALCIUM: 9.1 mg/dL (ref 8.9–10.3)
CO2: 25 mmol/L (ref 22–32)
Chloride: 108 mmol/L (ref 98–111)
Creatinine, Ser: 0.59 mg/dL (ref 0.44–1.00)
GFR calc non Af Amer: >60 mL/min (ref >60)
Glucose, Bld: 103 mg/dL — ABNORMAL HIGH (ref 70–99)
Potassium: 3.8 mmol/L (ref 3.5–5.1)
Sodium: 138 mmol/L (ref 135–145)
TOTAL PROTEIN: 6.7 g/dL (ref 6.5–8.1)

## 2018-07-13 LAB — CBC WITH DIFFERENTIAL/PLATELET
Basophils Absolute: 0.1 10*3/uL (ref 0–0.1)
Basophils Relative: 1 %
EOS ABS: 0.1 10*3/uL (ref 0–0.7)
Eosinophils Relative: 2 %
HEMATOCRIT: 38.9 % (ref 35.0–47.0)
HEMOGLOBIN: 13.4 g/dL (ref 12.0–16.0)
Lymphocytes Relative: 31 %
Lymphs Abs: 2.1 10*3/uL (ref 1.0–3.6)
MCH: 30.2 pg (ref 26.0–34.0)
MCHC: 34.5 g/dL (ref 32.0–36.0)
MCV: 87.6 fL (ref 80.0–100.0)
MONOS PCT: 7 %
Monocytes Absolute: 0.5 10*3/uL (ref 0.2–0.9)
NEUTROS PCT: 59 %
Neutro Abs: 4.1 10*3/uL (ref 1.4–6.5)
Platelets: 257 10*3/uL (ref 150–440)
RBC: 4.45 MIL/uL (ref 3.80–5.20)
RDW: 12.3 % (ref 11.5–14.5)
WBC: 7 10*3/uL (ref 3.6–11.0)

## 2018-07-13 LAB — LIPASE, BLOOD: Lipase: 30 U/L (ref 11–51)

## 2018-07-13 LAB — HCG, QUANTITATIVE, PREGNANCY

## 2018-07-13 MED ORDER — KETOROLAC TROMETHAMINE 30 MG/ML IJ SOLN: 15.0000 mg | Freq: Once | INTRAMUSCULAR | Status: DC

## 2018-07-13 MED ORDER — FAMOTIDINE 40 MG PO TABS: 40.0000 mg | ORAL_TABLET | Freq: Every day | ORAL | 1 refills | Status: DC

## 2018-07-13 MED ORDER — IOPAMIDOL (ISOVUE-300) INJECTION 61%
100.0000 mL | Freq: Once | INTRAVENOUS | Status: AC | PRN
  Administered 2018-07-13: 100 mL via INTRAVENOUS

## 2018-07-13 MED ORDER — ONDANSETRON HCL 4 MG/2ML IJ SOLN: 4.0000 mg | Freq: Once | INTRAMUSCULAR | Status: DC

## 2018-07-13 NOTE — ED Provider Notes (Signed)
[facility] Emergency Department Provider Note  ____________________________________________  Time seen: Approximately 2:16 AM  I have reviewed the triage vital signs and the nursing notes.   HISTORY  Chief Complaint Abdominal Pain   HPI Robin Mccall is a 22 y.o. female with history as listed below who presents for evaluation of right upper quadrant abdominal pain.  Patient reports 2 days of intermittent sharp right upper quadrant abdominal pain.  Pain is currently 3 out of 10.  She reports the pain before coming to the hospital with severe and she had 2 episodes of nonbloody nonbilious emesis which made her come in for evaluation.  Patient denies any prior abdominal surgeries.  She denies diarrhea, constipation, dysuria, hematuria, chest pain, shortness of breath, anorexia, fever.  She does endorse having chills this evening.  She denies worsening pain after eating.  Patient is currently on her menstrual period.  Past Medical History:  Diagnosis Date  . Abdominal pain   . Allergic rhinitis   . Asthma   . Dermatitis   . Dysmenorrhea   . Glucose found in urine on examination    seeing nephrologist on 12-07-15-pt unsure of what the MD's name is   . HA (headache)    migraines-related to stress  . HLD (hyperlipidemia)   . Hydronephrosis   . 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 Reflux   . Syncope   . Vitamin D deficiency   . Wrist pain     Patient Active Problem List   Diagnosis Date Noted  . Microscopic hematuria 02/12/2016  . 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 Dyspareunia in female 02/12/2016  . 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 DIAGNOSTIC LAPAROSCOPY    . GANGLION CYST EXCISION Left 12/14/2015   Procedure: REMOVAL GANGLION OF WRIST;  Surgeon: Deeann Saint, MD;  Location: [facility] ORS;  Service: Orthopedics;  Laterality: Left;  . laporoscopic abdomen      Prior to Admission medications   Medication Sig Start Date  End Date Taking? Authorizing Provider  albuterol (PROVENTIL HFA;VENTOLIN HFA) 108 (90 Base) MCG/ACT inhaler Inhale 2 puffs into the lungs every 6 (six) hours as needed for wheezing or shortness of breath.    [provider]  butalbital-acetaminophen-caffeine (FIORICET, ESGIC) 50-325-40 MG tablet Take 1 tablet by mouth 3 (three) times daily as needed for headache.    [provider]  famotidine (PEPCID) 40 MG tablet Take 1 tablet (40 mg total) by mouth daily. 07/13/18 07/13/19  Nita Sickle, MD  Norgestimate-Ethinyl Estradiol Triphasic (TRI-SPRINTEC) 0.18/0.215/0.25 MG-35 MCG tablet Take 1 tablet by mouth daily. Reported on 11/11/2015    [provider]    Allergies Patient has no known allergies.  Family History  Problem Relation Age of Onset  . Stroke Unknown   . Heart disease Unknown   . Cancer Unknown   . Kidney disease Neg Hx   . Bladder Cancer Neg Hx     Social History Social History   Tobacco Use  . Smoking status: Never Smoker  . Smokeless tobacco: Never Used  Substance Use Topics  . Alcohol use: No  . Drug use: No    Review of Systems  Constitutional: Negative for fever. Eyes: Negative for visual changes. ENT: Negative for sore throat. Neck: No neck pain  Cardiovascular: Negative for chest pain. Respiratory: Negative for shortness of breath. Gastrointestinal: + RUQ abdominal pain, nausea, and vomiting. No diarrhea. Genitourinary: Negative for dysuria. Musculoskeletal: Negative for back pain. Skin: Negative for  rash. Neurological: Negative for headaches, weakness or numbness. Psych: No SI or HI  ____________________________________________   PHYSICAL EXAM:  VITAL SIGNS: ED Triage Vitals  Enc Vitals Group     BP 07/13/18 0200 131/81     Pulse Rate 07/13/18 0157 (!) 113     Resp 07/13/18 0157 17     Temp 07/13/18 0157 98.1 F (36.7 C)     Temp Source 07/13/18 0157 Oral     SpO2 07/13/18 0157 100 %     Weight 07/13/18 0158 120  lb (54.4 kg)     Height 07/13/18 0158 5\' 3"  (1.6 m)     Head Circumference --      Peak Flow --      Pain Score 07/13/18 0158 3     Pain Loc --      Pain Edu? --      Excl. in GC? --     Constitutional: Alert and oriented. Well appearing and in no apparent distress. HEENT:      Head: Normocephalic and atraumatic.         Eyes: Conjunctivae are normal. Sclera is non-icteric.       Mouth/Throat: Mucous membranes are moist.       Neck: Supple with no signs of meningismus. Cardiovascular: Regular rate and rhythm. No murmurs, gallops, or rubs. 2+ symmetrical distal pulses are present in all extremities. No JVD. Respiratory: Normal respiratory effort. Lungs are clear to auscultation bilaterally. No wheezes, crackles, or rhonchi.  Gastrointestinal: Soft, right upper quadrant tenderness palpation with negative Murphy's, and non distended with positive bowel sounds. No rebound or guarding. Musculoskeletal: Nontender with normal range of motion in all extremities. No edema, cyanosis, or erythema of extremities. Neurologic: Normal speech and language. Face is symmetric. Moving all extremities. No gross focal neurologic deficits are appreciated. Skin: Skin is warm, dry and intact. No rash noted. Psychiatric: Mood and affect are normal. Speech and behavior are normal.  ____________________________________________   LABS (all labs ordered are listed, but only abnormal results are displayed)  Labs Reviewed  COMPREHENSIVE METABOLIC PANEL - Abnormal; Notable for the following components:      Result Value   Glucose, Bld 103 (*)    All other components within normal limits  LIPASE, BLOOD  HCG, QUANTITATIVE, PREGNANCY  CBC WITH DIFFERENTIAL/PLATELET  URINALYSIS, COMPLETE (UACMP) WITH MICROSCOPIC  POC URINE PREG, ED   ____________________________________________  EKG  none  ____________________________________________  RADIOLOGY  I have personally reviewed the images performed during  this visit and I agree with the Radiologist's read.   Interpretation by Radiologist:  Ct Abdomen Pelvis W Contrast  Result Date: 07/13/2018 CLINICAL DATA:  Right lower quadrant abdominal pain and vomiting beginning yesterday. EXAM: CT ABDOMEN AND PELVIS WITH CONTRAST TECHNIQUE: Multidetector CT imaging of the abdomen and pelvis was performed using the standard protocol following bolus administration of intravenous contrast. CONTRAST:  ISOVUE-300 IOPAMIDOL (ISOVUE-300) INJECTION 61% COMPARISON:  Right upper quadrant ultrasound 07/13/2018 FINDINGS: Lower chest: Lung bases are clear. Hepatobiliary: No focal liver abnormality is seen. No gallstones, gallbladder wall thickening, or biliary dilatation. Pancreas: Unremarkable. No pancreatic ductal dilatation or surrounding inflammatory changes. Spleen: Normal in size without focal abnormality. Adrenals/Urinary Tract: Adrenal glands are unremarkable. Kidneys are normal, without renal calculi, focal lesion, or hydronephrosis. Bladder is unremarkable. Stomach/Bowel: Stomach is decompressed but there appears to be edema and wall thickening in the pyloric region and duodenal bulb. This may indicate gastritis or peptic ulcer disease. Small bowel are decompressed. Scattered gas  and stool in the colon. No colonic wall thickening or inflammatory changes. Appendix is normal. Vascular/Lymphatic: No significant vascular findings are present. No enlarged abdominal or pelvic lymph nodes. Reproductive: Uterus and ovaries are not enlarged. Gas in the vagina, likely tampon. Other: No free air or free fluid in the abdomen. Abdominal wall musculature appears intact. Musculoskeletal: No acute or significant osseous findings. IMPRESSION: 1. Suggestion of edema and wall thickening in the pyloric region of the stomach and duodenal bulb. This may indicate gastritis or peptic ulcer disease. 2. No evidence of bowel obstruction. Appendix is normal. Electronically Signed   By: Burman Nieves M.D.   On: 07/13/2018 04:51   US Abdomen Limited Ruq  Result Date: 07/13/2018 CLINICAL DATA:  Initial evaluation for acute right upper quadrant pain with vomiting for 1 day. EXAM: ULTRASOUND ABDOMEN LIMITED RIGHT UPPER QUADRANT COMPARISON:  None. FINDINGS: Gallbladder: No gallstones or wall thickening visualized. No sonographic Murphy sign noted by sonographer. Common bile duct: Diameter: 5.1 mm Liver: No focal lesion identified. Within normal limits in parenchymal echogenicity. Portal vein is patent on color Doppler imaging with normal direction of blood flow towards the liver. IMPRESSION: Normal right upper quadrant ultrasound. Electronically Signed   By: Rise Mu M.D.   On: 07/13/2018 03:05      ____________________________________________   PROCEDURES  Procedure(s) performed: None Procedures Critical Care performed:  None ____________________________________________   INITIAL IMPRESSION / ASSESSMENT AND PLAN / ED COURSE   22 y.o. female with history as listed below who presents for evaluation of right upper quadrant abdominal pain.  Pain has been intermittent for 2 days, she is tender to palpation in the right upper quadrant with negative Murphy sign, no rebound or guarding.  She is afebrile, slightly tachycardic in the setting of 2 episodes of emesis this evening.  Differential diagnoses including gallbladder disease versus pancreatitis versus gastritis/ PUD versus kidney stone versus pyelonephritis versus appendicitis versus ovarian pathology.  At this time to do believe patient's symptoms are due to a PE due to intermittent pain, nonpleuritic pain, no shortness of breath.  Labs, urinalysis, pregnancy test, right upper quadrant ultrasound have been ordered.  I ordered Toradol and Zofran however patient refused at this time.    _________________________ 5:00 AM on 07/13/2018 -----------------------------------------  Ultrasound was negative for gallbladder  pathology.  Discussed risks and benefits of a CT to rule out appendicitis and patient chose to undergo CT.  CT is negative for appendicitis but showed edema and wall thickening in the pyloric region concerning for gastritis versus peptic ulcer disease.  Patient reports that she takes a lot of NSAIDs for migraine headaches however she was prescribed Fioricet by her primary care doctor a week ago.  I told her the Fioricet was okay but to stop taking NSAIDs and to start taking Pepcid.  I am also referring her to a GI specialist.  At this time patient is stable for discharge.  Discussed my standard return precautions   As part of my medical decision making, I reviewed the following data within the electronic MEDICAL RECORD NUMBER Nursing notes reviewed and incorporated, Labs reviewed , Old chart reviewed, Radiograph reviewed , Notes from prior ED visits and [place] Controlled Substance Database    Pertinent labs & imaging results that were available during my care of the patient were reviewed by me and considered in my medical decision making (see chart for details).    ____________________________________________   FINAL CLINICAL IMPRESSION(S) / ED DIAGNOSES  Final diagnoses:  RUQ abdominal pain  Acute gastritis without hemorrhage, unspecified gastritis type      NEW MEDICATIONS STARTED DURING THIS VISIT:  ED Discharge Orders         Ordered    famotidine (PEPCID) 40 MG tablet  Daily     07/13/18 0455           Note:  This document was prepared using Dragon voice recognition software and may include unintentional dictation errors.    Don Perking, [place], MD 07/13/18 [phone]

## 2018-07-13 NOTE — ED Triage Notes (Signed)
Patient c/o LRQ abdominal pain and emesis beginning yesterday.

## 2018-07-13 NOTE — ED Notes (Signed)
Patient discharged to home per MD order. Patient in stable condition, and deemed medically cleared by ED provider for discharge. Discharge instructions reviewed with patient/family using "Teach Back"; verbalized understanding of medication education and administration, and information about follow-up care. Denies further concerns. ° °

## 2018-07-16 ENCOUNTER — Ambulatory Visit (INDEPENDENT_AMBULATORY_CARE_PROVIDER_SITE_OTHER): Payer: 59 | Admitting: Gastroenterology

## 2018-07-16 ENCOUNTER — Encounter: Payer: Self-pay | Admitting: Gastroenterology

## 2018-07-16 VITALS — BP 108/64 | HR 66 | Ht 63.0 in | Wt 120.2 lb

## 2018-07-16 DIAGNOSIS — G8929 Other chronic pain: Secondary | ICD-10-CM | POA: Diagnosis not present

## 2018-07-16 DIAGNOSIS — R935 Abnormal findings on diagnostic imaging of other abdominal regions, including retroperitoneum: Secondary | ICD-10-CM

## 2018-07-16 DIAGNOSIS — R1011 Right upper quadrant pain: Secondary | ICD-10-CM

## 2018-07-16 MED ORDER — OMEPRAZOLE 20 MG PO CPDR: 20.0000 mg | DELAYED_RELEASE_CAPSULE | Freq: Every day | ORAL | 2 refills | Status: DC

## 2018-07-16 NOTE — Patient Instructions (Signed)
FU in 6 months

## 2018-07-16 NOTE — Progress Notes (Signed)
Robin Mccall [address]  [address]  [place], [place] [address]  Main: [phone]  Fax: [phone]   Gastroenterology Consultation  Referring Provider:     Evelene Croon, MD Primary Care Physician:  Robin Croon, MD Primary Gastroenterologist:  Dr. Melodie Mccall Reason for Consultation:     Abdominal pain, abnormal CT        HPI:    Chief Complaint  Patient presents with  . New Patient (Initial Visit)    ED F/U acute gastritis, RUQ pain    Robin Mccall is a 22 y.o. y/o female referred for consultation & management  by Dr. Evelene Croon, MD.  Patient went to the ER on September 6 due to abdominal pain.  Reports abdominal pain that started 2 days before that, therefore 5 to 6-day history of abdominal pain.  Right upper quadrant right mid quadrant, sharp, nonradiating.  On the first day it lasted for 2 to 3 hours and then stopped.  On the second day it lasted all day and she went to the ER.  Initially started as 8/10 pain and has improved since then.  Patient has not had any pain today.  But had 3/10 pain yesterday.  She took MiraLAX on the first day as she thought it might be due to constipation and that did not help her pain.  She had been taking ibuprofen, about 800 mg every day for the last week due to her migraine headaches.  During the ER visit, CT reported "suggestion of edema and wall thickening in the pyloric region of the stomach the duodenal bulb".  Ultrasound showed a normal gallbladder and common bile duct.  Liver enzymes and hemoglobin and platelets were normal.  Patient was given Pepcid and she thinks it has improved her pain.  However, she used ibuprofen twice since her ER visit, and she states it is because she was out of her Fioricet prescription for migraines.  She states she will be getting this today.  Past Medical History:  Diagnosis Date  . Abdominal pain   . Allergic rhinitis   . Asthma   . Dermatitis   . Dysmenorrhea   .  Glucose found in urine on examination    seeing nephrologist on 12-07-15-pt unsure of what the MD's name is   . HA (headache)    migraines-related to stress  . HLD (hyperlipidemia)   . Hydronephrosis   . 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 Reflux   . Syncope   . Vitamin D deficiency   . Wrist pain     Past Surgical History:  Procedure Laterality Date  . DIAGNOSTIC LAPAROSCOPY    . GANGLION CYST EXCISION Left 12/14/2015   Procedure: REMOVAL GANGLION OF WRIST;  Surgeon: Deeann Saint, MD;  Location: [facility] ORS;  Service: Orthopedics;  Laterality: Left;  . laporoscopic abdomen      Prior to Admission medications   Medication Sig Start Date End Date Taking? Authorizing Provider  albuterol (PROVENTIL HFA;VENTOLIN HFA) 108 (90 Base) MCG/ACT inhaler Inhale 2 puffs into the lungs every 6 (six) hours as needed for wheezing or shortness of breath.   Yes [provider]  butalbital-acetaminophen-caffeine (FIORICET, ESGIC) 50-325-40 MG tablet Take 1 tablet by mouth 3 (three) times daily as needed for headache.   Yes [provider]  famotidine (PEPCID) 40 MG tablet Take 1 tablet (40 mg total) by mouth daily. 07/13/18 07/13/19 Yes Veronese, [place], MD  Norgestimate-Ethinyl Estradiol Triphasic (TRI-SPRINTEC) 0.18/0.215/0.25 MG-35 MCG tablet Take 1 tablet by  mouth daily. Reported on 11/11/2015   Yes [provider]    Family History  Problem Relation Age of Onset  . Stroke Unknown   . Heart disease Unknown   . Cancer Unknown   . Kidney disease Neg Hx   . Bladder Cancer Neg Hx      Social History   Tobacco Use  . Smoking status: Never Smoker  . Smokeless tobacco: Never Used  Substance Use Topics  . Alcohol use: No  . Drug use: No    Allergies as of 07/16/2018  . (No Known Allergies)    Review of Systems:    All systems reviewed and negative except where noted in HPI.   Physical Exam:  BP 108/64   Pulse 66   Ht 5\' 3"  (1.6 m)   Wt 120 lb 3.2 oz (54.5 kg)   BMI  21.29 kg/m  No LMP recorded. Psych:  Alert and cooperative. Normal mood and affect. General:   Alert,  Well-developed, well-nourished, pleasant and cooperative in NAD Head:  Normocephalic and atraumatic. Eyes:  Sclera clear, no icterus.   Conjunctiva pink. Ears:  Normal auditory acuity. Nose:  No deformity, discharge, or lesions. Mouth:  No deformity or lesions,oropharynx pink & moist. Neck:  Supple; no masses or thyromegaly. Lungs:  Respirations even and unlabored.  Clear throughout to auscultation.   No wheezes, crackles, or rhonchi. No acute distress. Heart:  Regular rate and rhythm; no murmurs, clicks, rubs, or gallops. Abdomen:  Normal bowel sounds.  No bruits.  Soft, non-tender and non-distended without masses, hepatosplenomegaly or hernias noted.  No guarding or rebound tenderness.    Msk:  Symmetrical without gross deformities. Good, equal movement & strength bilaterally. Pulses:  Normal pulses noted. Extremities:  No clubbing or edema.  No cyanosis. Neurologic:  Alert and oriented x3;  grossly normal neurologically. Skin:  Intact without significant lesions or rashes. No jaundice. Lymph Nodes:  No significant cervical adenopathy. Psych:  Alert and cooperative. Normal mood and affect.   Labs: CBC    Component Value Date/Time   WBC 7.0 07/13/2018 0216   RBC 4.45 07/13/2018 0216   HGB 13.4 07/13/2018 0216   HGB 13.5 09/16/2014 1620   HCT 38.9 07/13/2018 0216   HCT 40.2 09/16/2014 1620   PLT 257 07/13/2018 0216   PLT 219 09/16/2014 1620   MCV 87.6 07/13/2018 0216   MCV 89 09/16/2014 1620   MCH 30.2 07/13/2018 0216   MCHC 34.5 07/13/2018 0216   RDW 12.3 07/13/2018 0216   RDW 12.9 09/16/2014 1620   LYMPHSABS 2.1 07/13/2018 0216   LYMPHSABS 1.7 09/16/2014 1620   MONOABS 0.5 07/13/2018 0216   MONOABS 0.6 09/16/2014 1620   EOSABS 0.1 07/13/2018 0216   EOSABS 0.1 09/16/2014 1620   BASOSABS 0.1 07/13/2018 0216   BASOSABS 0.0 09/16/2014 1620   CMP     Component Value  Date/Time   NA 138 07/13/2018 0216   NA 139 09/16/2014 1620   K 3.8 07/13/2018 0216   K 3.4 09/16/2014 1620   CL 108 07/13/2018 0216   CL 104 09/16/2014 1620   CO2 25 07/13/2018 0216   CO2 29 (H) 09/16/2014 1620   GLUCOSE 103 (H) 07/13/2018 0216   GLUCOSE 94 09/16/2014 1620   BUN 13 07/13/2018 0216   BUN 9 09/16/2014 1620   CREATININE 0.59 07/13/2018 0216   CREATININE 0.70 09/16/2014 1620   CALCIUM 9.1 07/13/2018 0216   CALCIUM 8.5 (L) 09/16/2014 1620   PROT 6.7 07/13/2018  0216   PROT 7.2 09/16/2014 1620   ALBUMIN 3.9 07/13/2018 0216   ALBUMIN 3.7 (L) 09/16/2014 1620   AST 24 07/13/2018 0216   AST 21 09/16/2014 1620   ALT 24 07/13/2018 0216   ALT 18 09/16/2014 1620   ALKPHOS 50 07/13/2018 0216   ALKPHOS 61 09/16/2014 1620   BILITOT 0.6 07/13/2018 0216   BILITOT 0.3 09/16/2014 1620   GFRNONAA >60 07/13/2018 0216   GFRAA >60 07/13/2018 0216    Imaging Studies: Ct Abdomen Pelvis W Contrast  Result Date: 07/13/2018 CLINICAL DATA:  Right lower quadrant abdominal pain and vomiting beginning yesterday. EXAM: CT ABDOMEN AND PELVIS WITH CONTRAST TECHNIQUE: Multidetector CT imaging of the abdomen and pelvis was performed using the standard protocol following bolus administration of intravenous contrast. CONTRAST:  ISOVUE-300 IOPAMIDOL (ISOVUE-300) INJECTION 61% COMPARISON:  Right upper quadrant ultrasound 07/13/2018 FINDINGS: Lower chest: Lung bases are clear. Hepatobiliary: No focal liver abnormality is seen. No gallstones, gallbladder wall thickening, or biliary dilatation. Pancreas: Unremarkable. No pancreatic ductal dilatation or surrounding inflammatory changes. Spleen: Normal in size without focal abnormality. Adrenals/Urinary Tract: Adrenal glands are unremarkable. Kidneys are normal, without renal calculi, focal lesion, or hydronephrosis. Bladder is unremarkable. Stomach/Bowel: Stomach is decompressed but there appears to be edema and wall thickening in the pyloric region and  duodenal bulb. This may indicate gastritis or peptic ulcer disease. Small bowel are decompressed. Scattered gas and stool in the colon. No colonic wall thickening or inflammatory changes. Appendix is normal. Vascular/Lymphatic: No significant vascular findings are present. No enlarged abdominal or pelvic lymph nodes. Reproductive: Uterus and ovaries are not enlarged. Gas in the vagina, likely tampon. Other: No free air or free fluid in the abdomen. Abdominal wall musculature appears intact. Musculoskeletal: No acute or significant osseous findings. IMPRESSION: 1. Suggestion of edema and wall thickening in the pyloric region of the stomach and duodenal bulb. This may indicate gastritis or peptic ulcer disease. 2. No evidence of bowel obstruction. Appendix is normal. Electronically Signed   By: Burman Nieves M.D.   On: 07/13/2018 04:51   US Abdomen Limited Ruq  Result Date: 07/13/2018 CLINICAL DATA:  Initial evaluation for acute right upper quadrant pain with vomiting for 1 day. EXAM: ULTRASOUND ABDOMEN LIMITED RIGHT UPPER QUADRANT COMPARISON:  None. FINDINGS: Gallbladder: No gallstones or wall thickening visualized. No sonographic Murphy sign noted by sonographer. Common bile duct: Diameter: 5.1 mm Liver: No focal lesion identified. Within normal limits in parenchymal echogenicity. Portal vein is patent on color Doppler imaging with normal direction of blood flow towards the liver. IMPRESSION: Normal right upper quadrant ultrasound. Electronically Signed   By: Rise Mu M.D.   On: 07/13/2018 03:05    Assessment and Plan:   JASANI LENGEL is a 22 y.o. y/o female has been referred for abdominal pain, with CT suggesting edema of the pyloric region of the stomach  Possible gastritis versus peptic ulcer disease from NSAID use Patient asked to avoid NSAIDs We will change Pepcid to omeprazole 20 mg once daily (Risks of PPI use were discussed with patient including bone loss, C. Diff diarrhea,  pneumonia, infections, CKD, electrolyte abnormalities.  If clinically possible based on symptoms, goal would be to maintain patient on the lowest dose possible, or discontinue the medication with institution of acid reflux lifestyle modifications over time. Pt. Verbalizes understanding and chooses to continue the medication.)  Abnormal CT finding would benefit from visualization with EGD to rule out peptic ulcer disease Risks and benefits of procedure  explained in detail, patient would like to endoscopic visualization Alternative of management with PPI to see if symptoms improve completely discussed, but patient would like endoscopic visualization which is reasonable  I have discussed alternative options, risks & benefits,  which include, but are not limited to, bleeding, infection, perforation,respiratory complication & drug reaction.  The patient agrees with this plan & written consent will be obtained.      Dr Robin Mccall

## 2018-07-16 NOTE — Addendum Note (Signed)
Addended by: Jackquline Denmark on: 07/16/2018 01:28 PM   Modules accepted: Orders

## 2018-07-16 NOTE — Addendum Note (Signed)
Addended by: Jackquline Denmark on: 07/16/2018 04:44 PM   Modules accepted: Orders, SmartSet

## 2018-08-16 ENCOUNTER — Telehealth: Payer: Self-pay

## 2018-08-16 NOTE — Telephone Encounter (Signed)
LVM with patient to let her know I received her cancellation message regarding her EGD.  There is a cancellation fee that she will need to pay prior to rescheduling the procedure when she is ready to reschedule.  Thanks [employer]

## 2018-08-16 NOTE — Telephone Encounter (Signed)
Patient stated that she feels she should not have to pay cancellation fee because she was just informed on yesterday about the fact that she would be expected to pay $900 up front for having her EGD.  And she called to cancel soon as she found this out.  Thanks [employer]

## 2018-08-17 ENCOUNTER — Ambulatory Visit: Admission: RE | Admit: 2018-08-17 | Payer: 59 | Source: Ambulatory Visit | Admitting: Gastroenterology

## 2018-08-17 ENCOUNTER — Encounter: Admission: RE | Payer: Self-pay | Source: Ambulatory Visit

## 2018-08-17 SURGERY — ESOPHAGOGASTRODUODENOSCOPY (EGD) WITH PROPOFOL
Anesthesia: General

## 2019-04-16 ENCOUNTER — Other Ambulatory Visit: Payer: Self-pay

## 2019-04-16 ENCOUNTER — Other Ambulatory Visit (HOSPITAL_COMMUNITY)
Admission: RE | Admit: 2019-04-16 | Discharge: 2019-04-16 | Disposition: A | Discharge status: Home / Self Care | Payer: 59 | Source: Ambulatory Visit | Attending: Obstetrics and Gynecology | Admitting: Obstetrics and Gynecology

## 2019-04-16 ENCOUNTER — Ambulatory Visit (INDEPENDENT_AMBULATORY_CARE_PROVIDER_SITE_OTHER): Payer: 59 | Admitting: Obstetrics and Gynecology

## 2019-04-16 ENCOUNTER — Encounter: Payer: Self-pay | Admitting: Obstetrics and Gynecology

## 2019-04-16 VITALS — BP 104/70 | Ht 63.0 in | Wt 125.8 lb

## 2019-04-16 DIAGNOSIS — Z124 Encounter for screening for malignant neoplasm of cervix: Secondary | ICD-10-CM

## 2019-04-16 DIAGNOSIS — Z113 Encounter for screening for infections with a predominantly sexual mode of transmission: Secondary | ICD-10-CM | POA: Diagnosis present

## 2019-04-16 DIAGNOSIS — N760 Acute vaginitis: Secondary | ICD-10-CM

## 2019-04-16 LAB — POCT WET PREP WITH KOH
Clue Cells Wet Prep HPF POC: NEGATIVE
KOH Prep POC: NEGATIVE
Trichomonas, UA: NEGATIVE
Yeast Wet Prep HPF POC: NEGATIVE

## 2019-04-16 MED ORDER — FLUCONAZOLE 150 MG PO TABS: 150.0000 mg | ORAL_TABLET | Freq: Once | ORAL | 0 refills | Status: DC

## 2019-04-16 NOTE — Patient Instructions (Signed)
I value your feedback and entrusting us with your care. If you get a [place] patient survey, I would appreciate you taking the time to let us know about your experience today. Thank you! 

## 2019-04-16 NOTE — Progress Notes (Signed)
Lorelee Market, MD   Chief Complaint  Patient presents with  . Vaginal irritation    no discharge/odor, external vaginal burning (mainly when pt wipes), pt says no uti symptoms x 3-4 days    HPI:      [name] is a 23 y.o. G0P0000 who LMP was Patient's last menstrual period was 03/25/2019 (approximate)., presents today for external vag irritation without increased d/c, odor for 3-4 days. Gets sx frequently, usually before menses. No meds to treat. Usually gets better on its own after menses. No recent abx use. Uses dove sens skin soap, no dryer sheets or wipes. Wears pantyliners daily due to normal d/c but changes frequently. No urin sx, LBP, fevers, pelvic pain. Pt is sex active, with new partner. Uses OCPs, no condoms. No hx of STDs. No recent pap.    Past Medical History:  Diagnosis Date  . Abdominal pain   . Allergic rhinitis   . Asthma   . Dermatitis   . Dysmenorrhea   . Glucose found in urine on examination    seeing nephrologist on 12-07-15-pt unsure of what the MD's name is   . HA (headache)    migraines-related to stress  . HLD (hyperlipidemia)   . Hydronephrosis   . 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 Reflux   . Syncope   . Vitamin D deficiency   . Wrist pain     Past Surgical History:  Procedure Laterality Date  . DIAGNOSTIC LAPAROSCOPY    . GANGLION CYST EXCISION Left 12/14/2015   Procedure: REMOVAL GANGLION OF WRIST;  Surgeon: Earnestine Leys, MD;  Location: [facility] ORS;  Service: Orthopedics;  Laterality: Left;  . laporoscopic abdomen      Family History  Problem Relation Age of Onset  . Stroke Other   . Heart disease Other   . Cancer Other   . Kidney disease Neg Hx   . Bladder Cancer Neg Hx     Social History   Socioeconomic History  . Marital status: Single    Spouse name: Not on file  . Number of children: Not on file  . Years of education: Not on file  . Highest education level: Not on file  Occupational History  . Not on file  Social  Needs  . Financial resource strain: Not on file  . Food insecurity:    Worry: Not on file    Inability: Not on file  . Transportation needs:    Medical: Not on file    Non-medical: Not on file  Tobacco Use  . Smoking status: Never Smoker  . Smokeless tobacco: Never Used  Substance and Sexual Activity  . Alcohol use: No  . Drug use: No  . Sexual activity: Yes    Birth control/protection: Pill  Lifestyle  . Physical activity:    Days per week: Not on file    Minutes per session: Not on file  . Stress: Not on file  Relationships  . Social connections:    Talks on phone: Not on file    Gets together: Not on file    Attends religious service: Not on file    Active member of club or organization: Not on file    Attends meetings of clubs or organizations: Not on file    Relationship status: Not on file  . Intimate partner violence:    Fear of current or ex partner: Not on file    Emotionally abused: Not on file    Physically abused:  Not on file    Forced sexual activity: Not on file  Other Topics Concern  . Not on file  Social History Narrative  . Not on file    Outpatient Medications Prior to Visit  Medication Sig Dispense Refill  . Norgestimate-Ethinyl Estradiol Triphasic (TRI-SPRINTEC) 0.18/0.215/0.25 MG-35 MCG tablet Take 1 tablet by mouth daily. Reported on 11/11/2015    . albuterol (PROVENTIL HFA;VENTOLIN HFA) 108 (90 Base) MCG/ACT inhaler Inhale 2 puffs into the lungs every 6 (six) hours as needed for wheezing or shortness of breath.    . butalbital-acetaminophen-caffeine (FIORICET, ESGIC) 50-325-40 MG tablet Take 1 tablet by mouth 3 (three) times daily as needed for headache.    Marland Kitchen. omeprazole (PRILOSEC) 20 MG capsule Take 1 capsule (20 mg total) by mouth daily. 30 capsule 2   No facility-administered medications prior to visit.       ROS:  Review of Systems  Constitutional: Negative for fatigue, fever and unexpected weight change.  Respiratory: Negative for  cough, shortness of breath and wheezing.   Cardiovascular: Negative for chest pain, palpitations and leg swelling.  Gastrointestinal: Negative for blood in stool, constipation, diarrhea, nausea and vomiting.  Endocrine: Negative for cold intolerance, heat intolerance and polyuria.  Genitourinary: Positive for dysuria and vaginal discharge. Negative for dyspareunia, flank pain, frequency, genital sores, hematuria, menstrual problem, pelvic pain, urgency, vaginal bleeding and vaginal pain.  Musculoskeletal: Negative for back pain, joint swelling and myalgias.  Skin: Negative for rash.  Neurological: Negative for dizziness, syncope, light-headedness, numbness and headaches.  Hematological: Negative for adenopathy.  Psychiatric/Behavioral: Negative for agitation, confusion, sleep disturbance and suicidal ideas. The patient is not nervous/anxious.    BREAST: No symptoms   OBJECTIVE:   Vitals:  BP 104/70   Ht 5\' 3"  (1.6 m)   Wt 125 lb 12.8 oz (57.1 kg)   LMP 03/25/2019 (Approximate)   BMI 22.28 kg/m   Physical Exam Vitals signs reviewed.  Constitutional:      Appearance: She is well-developed.  Neck:     Musculoskeletal: Normal range of motion.  Pulmonary:     Effort: Pulmonary effort is normal.  Genitourinary:    General: Normal vulva.     Pubic Area: No rash.      Labia:        Right: No rash, tenderness or lesion.        Left: No rash, tenderness or lesion.      Vagina: Vaginal discharge present. No erythema or tenderness.     Cervix: Normal.     Uterus: Normal. Not enlarged and not tender.      Adnexa: Right adnexa normal and left adnexa normal.       Right: No mass or tenderness.         Left: No mass or tenderness.       Comments: ERYTHEMA AT INTROITUS; NO LESIONS Musculoskeletal: Normal range of motion.  Skin:    General: Skin is warm and dry.  Neurological:     General: No focal deficit present.     Mental Status: She is alert and oriented to person, place, and  time.  Psychiatric:        Mood and Affect: Mood normal.        Behavior: Behavior normal.        Thought Content: Thought content normal.        Judgment: Judgment normal.     Results: Results for orders placed or performed in visit on 04/16/19 (from the past 24  hour(s))  POCT Wet Prep with KOH     Status: Normal   Collection Time: 04/16/19  4:47 PM  Result Value Ref Range   Trichomonas, UA Negative    Clue Cells Wet Prep HPF POC NEG    Epithelial Wet Prep HPF POC     Yeast Wet Prep HPF POC NEG    Bacteria Wet Prep HPF POC     RBC Wet Prep HPF POC     WBC Wet Prep HPF POC     KOH Prep POC Negative Negative     Assessment/Plan: Acute vaginitis - Pos sx/exam, neg wet prep. Most likely yeast. Rx diflucan, OTC hydrocortisone crm. F/u prn. May need culture if sx recur.  - Plan: POCT Wet Prep with KOH, fluconazole (DIFLUCAN) 150 MG tablet  Cervical cancer screening - Plan: Cytology - PAP  Screening for STD (sexually transmitted disease) - Plan: Cytology - PAP    Meds ordered this encounter  Medications  . fluconazole (DIFLUCAN) 150 MG tablet    Sig: Take 1 tablet (150 mg total) by mouth once for 1 dose.    Dispense:  1 tablet    Refill:  0    Order Specific Question:   Supervising Provider    Answer:   Nadara MustardHARRIS, ROBERT P [[id number]]      Return if symptoms worsen or fail to improve.  Alicia B. Copland, PA-C 04/16/2019 4:48 PM

## 2019-04-18 LAB — CYTOLOGY - PAP
Chlamydia: NEGATIVE
Diagnosis: NEGATIVE
Neisseria Gonorrhea: NEGATIVE

## 2019-05-08 ENCOUNTER — Encounter: Payer: Self-pay | Admitting: Obstetrics and Gynecology

## 2019-05-08 ENCOUNTER — Other Ambulatory Visit: Payer: Self-pay

## 2019-05-08 ENCOUNTER — Ambulatory Visit (INDEPENDENT_AMBULATORY_CARE_PROVIDER_SITE_OTHER): Payer: 59 | Admitting: Obstetrics and Gynecology

## 2019-05-08 VITALS — BP 116/72 | Ht 63.0 in | Wt 126.0 lb

## 2019-05-08 DIAGNOSIS — B373 Candidiasis of vulva and vagina: Secondary | ICD-10-CM

## 2019-05-08 DIAGNOSIS — B3731 Acute candidiasis of vulva and vagina: Secondary | ICD-10-CM

## 2019-05-08 LAB — POCT WET PREP WITH KOH
Clue Cells Wet Prep HPF POC: NEGATIVE
KOH Prep POC: NEGATIVE
Trichomonas, UA: NEGATIVE

## 2019-05-08 MED ORDER — TERCONAZOLE 0.8 % VA CREA: 1.0000 | TOPICAL_CREAM | Freq: Every day | VAGINAL | 0 refills | Status: DC

## 2019-05-08 NOTE — Patient Instructions (Signed)
I value your feedback and entrusting us with your care. If you get a [facility] patient survey, I would appreciate you taking the time to let us know about your experience today. Thank you! 

## 2019-05-08 NOTE — Progress Notes (Signed)
Evelene CroonNiemeyer, Meindert, MD   Chief Complaint  Patient presents with  . Vaginitis    discharge without odor, irritation, no itchiness x 4 days    HPI:      [name] is a 23 y.o. G0P0000 who LMP was Patient's last menstrual period was 04/15/2019 (approximate)., presents today for increased vag d/c with irritation and dysuria, no odor, for 5 days. Sx started after sex. Had vag irritation at 04/16/19 annual without d/c, but pap showed yeast and pt treated with diflucan with sx relief. Sx recurred, however. Pt with hx of recurring yeast vag. Treated with 1 dose monistat-3 this time with lots of burning so stopped tx. Has used monistat-7 in past without probs. Pt is sex active, no new partners, not using condoms. Neg STD testing 04/16/19. No LBP, pelvic pain, urin sx, fevers. No prior abx use.    Past Medical History:  Diagnosis Date  . Abdominal pain   . Allergic rhinitis   . Asthma   . Dermatitis   . Dysmenorrhea   . Glucose found in urine on examination    seeing nephrologist on 12-07-15-pt unsure of what the MD's name is   . HA (headache)    migraines-related to stress  . HLD (hyperlipidemia)   . Hydronephrosis   . 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 Reflux   . Syncope   . Vitamin D deficiency   . Wrist pain     Past Surgical History:  Procedure Laterality Date  . DIAGNOSTIC LAPAROSCOPY    . GANGLION CYST EXCISION Left 12/14/2015   Procedure: REMOVAL GANGLION OF WRIST;  Surgeon: Deeann SaintHoward Miller, MD;  Location: [facility] ORS;  Service: Orthopedics;  Laterality: Left;  . laporoscopic abdomen      Family History  Problem Relation Age of Onset  . Stroke Other   . Heart disease Other   . Cancer Other   . Kidney disease Neg Hx   . Bladder Cancer Neg Hx     Social History   Socioeconomic History  . Marital status: Single    Spouse name: Not on file  . Number of children: Not on file  . Years of education: Not on file  . Highest education level: Not on file  Occupational History   . Not on file  Social Needs  . Financial resource strain: Not on file  . Food insecurity    Worry: Not on file    Inability: Not on file  . Transportation needs    Medical: Not on file    Non-medical: Not on file  Tobacco Use  . Smoking status: Never Smoker  . Smokeless tobacco: Never Used  Substance and Sexual Activity  . Alcohol use: No  . Drug use: No  . Sexual activity: Yes    Birth control/protection: Pill  Lifestyle  . Physical activity    Days per week: Not on file    Minutes per session: Not on file  . Stress: Not on file  Relationships  . Social Musicianconnections    Talks on phone: Not on file    Gets together: Not on file    Attends religious service: Not on file    Active member of club or organization: Not on file    Attends meetings of clubs or organizations: Not on file    Relationship status: Not on file  . Intimate partner violence    Fear of current or ex partner: Not on file    Emotionally abused: Not on  file    Physically abused: Not on file    Forced sexual activity: Not on file  Other Topics Concern  . Not on file  Social History Narrative  . Not on file    Outpatient Medications Prior to Visit  Medication Sig Dispense Refill  . Norgestimate-Ethinyl Estradiol Triphasic (TRI-SPRINTEC) 0.18/0.215/0.25 MG-35 MCG tablet Take 1 tablet by mouth daily. Reported on 11/11/2015     No facility-administered medications prior to visit.       ROS:  Review of Systems  Constitutional: Negative for fever.  Gastrointestinal: Negative for blood in stool, constipation, diarrhea, nausea and vomiting.  Genitourinary: Positive for dysuria and vaginal discharge. Negative for dyspareunia, flank pain, frequency, hematuria, urgency, vaginal bleeding and vaginal pain.  Musculoskeletal: Negative for back pain.  Skin: Negative for rash.   BREAST: No symptoms   OBJECTIVE:   Vitals:  BP 116/72   Ht 5\' 3"  (1.6 m)   Wt 126 lb (57.2 kg)   LMP 04/15/2019 (Approximate)    BMI 22.32 kg/m   Physical Exam Vitals signs reviewed.  Constitutional:      Appearance: She is well-developed.  Neck:     Musculoskeletal: Normal range of motion.  Pulmonary:     Effort: Pulmonary effort is normal.  Genitourinary:    General: Normal vulva.     Pubic Area: No rash.      Labia:        Right: No rash, tenderness or lesion.        Left: No rash, tenderness or lesion.      Vagina: Vaginal discharge present. No erythema or tenderness.     Cervix: Normal.     Uterus: Normal. Not enlarged and not tender.      Adnexa: Right adnexa normal and left adnexa normal.       Right: No mass or tenderness.         Left: No mass or tenderness.       Comments: MILD ERYTHEMA AT INTROITUS Musculoskeletal: Normal range of motion.  Skin:    General: Skin is warm and dry.  Neurological:     General: No focal deficit present.     Mental Status: She is alert and oriented to person, place, and time.  Psychiatric:        Mood and Affect: Mood normal.        Behavior: Behavior normal.        Thought Content: Thought content normal.        Judgment: Judgment normal.     Results: Results for orders placed or performed in visit on 05/08/19 (from the past 24 hour(s))  POCT Wet Prep with KOH     Status: Normal   Collection Time: 05/08/19  4:54 PM  Result Value Ref Range   Trichomonas, UA Negative    Clue Cells Wet Prep HPF POC neg    Epithelial Wet Prep HPF POC     Yeast Wet Prep HPF POC few    Bacteria Wet Prep HPF POC     RBC Wet Prep HPF POC     WBC Wet Prep HPF POC     KOH Prep POC Negative Negative     Assessment/Plan: Candidal vaginitis - Plan: POCT Wet Prep with KOH, terconazole (TERAZOL 3) 0.8 % vaginal cream, Pos sx/wet prep. Rx terazol. F/u prn. Start probiotics. Will RF if sx recur.    Meds ordered this encounter  Medications  . terconazole (TERAZOL 3) 0.8 % vaginal cream  Sig: Place 1 applicator vaginally at bedtime for 3 days.    Dispense:  20 g    Refill:   0    Order Specific Question:   Supervising Provider    Answer:   Gae Dry [[id number]]      Return if symptoms worsen or fail to improve.  Kalima Saylor B. Rusti Arizmendi, PA-C 05/08/2019 4:55 PM

## 2020-04-02 ENCOUNTER — Telehealth: Payer: Self-pay

## 2020-04-02 NOTE — Telephone Encounter (Signed)
Pt calling triage c/o yeast infection symptoms. She needs an appt, its been almost a year since we have seen her. Please help get scheduled.

## 2020-04-02 NOTE — Telephone Encounter (Signed)
Patient reports has hx of yeast infections and was told to call and ask for medication to be sent in for her. Please advise

## 2020-04-03 ENCOUNTER — Other Ambulatory Visit: Payer: Self-pay | Admitting: Obstetrics and Gynecology

## 2020-04-03 DIAGNOSIS — B3731 Acute candidiasis of vulva and vagina: Secondary | ICD-10-CM

## 2020-04-03 MED ORDER — TERCONAZOLE 0.8 % VA CREA: 1.0000 | TOPICAL_CREAM | Freq: Every day | VAGINAL | 0 refills | Status: AC

## 2020-04-03 NOTE — Progress Notes (Signed)
Rx RF terazol for yeast vag sx.  

## 2020-04-03 NOTE — Telephone Encounter (Signed)
Called pt, no answer, left msg saying Rx sent to pharmacy.

## 2020-04-03 NOTE — Telephone Encounter (Signed)
Patient aware rx was sent in. Inquiring if she needs to continue or discontinue OTC meds? Cb#[phone]

## 2020-04-03 NOTE — Telephone Encounter (Signed)
Spoke w/patient. Advised to d/c OTC generic monistat and use rx Terazol cream

## 2020-04-03 NOTE — Telephone Encounter (Signed)
Rx RF eRxd. Thx.  

## 2020-04-12 ENCOUNTER — Other Ambulatory Visit: Payer: Self-pay

## 2020-04-12 ENCOUNTER — Emergency Department
Admission: EM | Admit: 2020-04-12 | Discharge: 2020-04-12 | Disposition: A | Discharge status: Home / Self Care | Payer: 59 | Attending: Emergency Medicine | Admitting: Emergency Medicine

## 2020-04-12 DIAGNOSIS — G43809 Other migraine, not intractable, without status migrainosus: Secondary | ICD-10-CM | POA: Insufficient documentation

## 2020-04-12 DIAGNOSIS — Z79899 Other long term (current) drug therapy: Secondary | ICD-10-CM | POA: Insufficient documentation

## 2020-04-12 DIAGNOSIS — J45909 Unspecified asthma, uncomplicated: Secondary | ICD-10-CM | POA: Insufficient documentation

## 2020-04-12 MED ORDER — KETOROLAC TROMETHAMINE 30 MG/ML IJ SOLN
30.0000 mg | Freq: Once | INTRAMUSCULAR | Status: AC
  Administered 2020-04-12: 30 mg via INTRAMUSCULAR
  Filled 2020-04-12: qty 1

## 2020-04-12 MED ORDER — IBUPROFEN 600 MG PO TABS: 600.0000 mg | ORAL_TABLET | Freq: Four times a day (QID) | ORAL | 0 refills | Status: DC | PRN

## 2020-04-12 MED ORDER — ONDANSETRON 4 MG PO TBDP
4.0000 mg | ORAL_TABLET | Freq: Once | ORAL | Status: AC
  Administered 2020-04-12: 4 mg via ORAL
  Filled 2020-04-12: qty 1

## 2020-04-12 NOTE — ED Notes (Signed)
Per Dr. Marisa Severin, no blood at this time.

## 2020-04-12 NOTE — ED Triage Notes (Signed)
Pt comes POV with migraine for 9 hours. Pt states she has a history. Vomitedx1 on the way here from it. States normally they don't last this long and she normally doesn't get sick.

## 2020-04-12 NOTE — ED Notes (Signed)
NAD noted at time of D/C. Pt denies questions or concerns. Pt ambulatory to the lobby at this time.  

## 2020-04-12 NOTE — ED Provider Notes (Signed)
[facility] Emergency Department Provider Note  ____________________________________________  Time seen: Approximately 2:12 PM  I have reviewed the triage vital signs and the nursing notes.   HISTORY  Chief Complaint Migraine    HPI Robin Mccall is a 24 y.o. female that presents to emergency department for evaluation of frontal headache for 8 hours today.  Patient states that she gets daily headaches.  They usually resolve when she takes her Mobic.  Today her headache did not resolve.  She did not take any additional medications.  She was initially started on Mobic for some hip pain but her doctor told her that it would help her daily headaches to see if she has continued to take them for abortive headache therapy.  Patient did have one episode of vomiting.  No visual changes, dizziness.  Past Medical History:  Diagnosis Date  . Abdominal pain   . Allergic rhinitis   . Asthma   . Dermatitis   . Dysmenorrhea   . Glucose found in urine on examination    seeing nephrologist on 12-07-15-pt unsure of what the MD's name is   . HA (headache)    migraines-related to stress  . HLD (hyperlipidemia)   . Hydronephrosis   . 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 Reflux   . Syncope   . Vitamin D deficiency   . Wrist pain     Patient Active Problem List   Diagnosis Date Noted  . Microscopic hematuria 02/12/2016  . 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 Dyspareunia in female 02/12/2016  . 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 DIAGNOSTIC LAPAROSCOPY    . GANGLION CYST EXCISION Left 12/14/2015   Procedure: REMOVAL GANGLION OF WRIST;  Surgeon: Deeann Saint, MD;  Location: [facility] ORS;  Service: Orthopedics;  Laterality: Left;  . laporoscopic abdomen      Prior to Admission medications   Medication Sig Start Date End Date Taking? Authorizing Provider  ibuprofen (ADVIL) 600 MG tablet Take 1 tablet (600 mg total) by mouth every 6 (six) hours  as needed. 04/12/20   Enid Derry, PA-C  Norgestimate-Ethinyl Estradiol Triphasic (TRI-SPRINTEC) 0.18/0.215/0.25 MG-35 MCG tablet Take 1 tablet by mouth daily. Reported on 11/11/2015    [provider]    Allergies Patient has no known allergies.  Family History  Problem Relation Age of Onset  . Stroke Other   . Heart disease Other   . Cancer Other   . Kidney disease Neg Hx   . Bladder Cancer Neg Hx     Social History Social History   Tobacco Use  . Smoking status: Never Smoker  . Smokeless tobacco: Never Used  Substance Use Topics  . Alcohol use: No  . Drug use: No     Review of Systems  Constitutional: No fever/chills Cardiovascular: No chest pain. Respiratory: No SOB. Gastrointestinal: No abdominal pain.  No nausea. Positive for vomiting x1.  Musculoskeletal: Negative for musculoskeletal pain. Skin: Negative for rash, abrasions, lacerations, ecchymosis. Neurological: Negative for numbness or tingling.  Positive for headache.   ____________________________________________   PHYSICAL EXAM:  VITAL SIGNS: ED Triage Vitals [04/12/20 1210]  Enc Vitals Group     BP (!) 115/58     Pulse Rate 85     Resp 18     Temp 98.3 F (36.8 C)     Temp Source Oral     SpO2 99 %     Weight 125 lb (56.7 kg)     Height  5\' 3"  (1.6 m)     Head Circumference      Peak Flow      Pain Score 10     Pain Loc      Pain Edu?      Excl. in [place]?      Constitutional: Alert and oriented. Well appearing and in no acute distress. Eyes: Conjunctivae are normal. PERRL. EOMI. Head: Atraumatic. ENT:      Ears:      Nose: No congestion/rhinnorhea.      Mouth/Throat: Mucous membranes are moist.  Neck: No stridor.  Cardiovascular: Normal rate, regular rhythm.  Good peripheral circulation. Respiratory: Normal respiratory effort without tachypnea or retractions. Lungs CTAB. Good air entry to the bases with no decreased or absent breath sounds. Musculoskeletal: Full range of  motion to all extremities. No gross deformities appreciated. Neurologic: Normal speech and language. No gross focal neurologic deficits are appreciated.  Cranial nerves: 2-10 normal as tested. Strength 5/5 in upper and lower extremities Cerebellar: Finger-nose-finger WNL, Heel to shin WNL Sensorimotor: No pronator drift, clonus, sensory loss or abnormal reflexes. No vision deficits noted to confrontation bilaterally.  Speech: No dysarthria or expressive aphasia Skin:  Skin is warm, dry and intact. No rash noted. Psychiatric: Mood and affect are normal. Speech and behavior are normal. Patient exhibits appropriate insight and judgement.   ____________________________________________   LABS (all labs ordered are listed, but only abnormal results are displayed)  Labs Reviewed - No data to display ____________________________________________  EKG   ____________________________________________  RADIOLOGY   No results found.  ____________________________________________    PROCEDURES  Procedure(s) performed:    Procedures    Medications  ketorolac (TORADOL) 30 MG/ML injection 30 mg (30 mg Intramuscular Given 04/12/20 1434)  ondansetron (ZOFRAN-ODT) disintegrating tablet 4 mg (4 mg Oral Given 04/12/20 1434)     ____________________________________________   INITIAL IMPRESSION / ASSESSMENT AND PLAN / ED COURSE  Pertinent labs & imaging results that were available during my care of the patient were reviewed by me and considered in my medical decision making (see chart for details).  Review of the [place] CSRS was performed in accordance of the [place] prior to dispensing any controlled drugs.     Patient's diagnosis is consistent with migraine.  Migraine significantly improved following Toradol and Zofran.  She will take Benadryl when she gets home, as she is driving.  Patient will be discharged home with prescriptions for ibuprofen. Patient is to follow up with PCP as directed.  Patient is given ED precautions to return to the ED for any worsening or new symptoms.   Robin Mccall was evaluated in Emergency Department on 04/12/2020 for the symptoms described in the history of present illness. She was evaluated in the context of the global COVID-19 pandemic, which necessitated consideration that the patient might be at risk for infection with the SARS-CoV-2 virus that causes COVID-19. Institutional protocols and algorithms that pertain to the evaluation of patients at risk for COVID-19 are in a state of rapid change based on information released by regulatory bodies including the CDC and federal and state organizations. These policies and algorithms were followed during the patient's care in the ED.  ____________________________________________  FINAL CLINICAL IMPRESSION(S) / ED DIAGNOSES  Final diagnoses:  Other migraine without status migrainosus, not intractable      NEW MEDICATIONS STARTED DURING THIS VISIT:  ED Discharge Orders         Ordered    ibuprofen (ADVIL) 600 MG tablet  Every  6 hours PRN     04/12/20 1506              This chart was dictated using voice recognition software/Dragon. Despite best efforts to proofread, errors can occur which can change the meaning. Any change was purely unintentional.    Enid Derry, PA-C 04/12/20 1620    Dionne Bucy, MD 04/12/20 [phone]

## 2020-04-14 ENCOUNTER — Ambulatory Visit: Payer: Medicaid Other | Admitting: Obstetrics and Gynecology

## 2020-04-14 ENCOUNTER — Other Ambulatory Visit (HOSPITAL_COMMUNITY)
Admission: RE | Admit: 2020-04-14 | Discharge: 2020-04-14 | Disposition: A | Discharge status: Home / Self Care | Payer: Medicaid Other | Source: Ambulatory Visit | Attending: Obstetrics and Gynecology | Admitting: Obstetrics and Gynecology

## 2020-04-14 ENCOUNTER — Encounter: Payer: Self-pay | Admitting: Obstetrics and Gynecology

## 2020-04-14 ENCOUNTER — Other Ambulatory Visit: Payer: Self-pay

## 2020-04-14 VITALS — BP 120/70 | Ht 63.0 in | Wt 137.0 lb

## 2020-04-14 DIAGNOSIS — B9689 Other specified bacterial agents as the cause of diseases classified elsewhere: Secondary | ICD-10-CM

## 2020-04-14 DIAGNOSIS — Z113 Encounter for screening for infections with a predominantly sexual mode of transmission: Secondary | ICD-10-CM | POA: Insufficient documentation

## 2020-04-14 DIAGNOSIS — N760 Acute vaginitis: Secondary | ICD-10-CM | POA: Diagnosis not present

## 2020-04-14 DIAGNOSIS — R300 Dysuria: Secondary | ICD-10-CM

## 2020-04-14 LAB — POCT URINALYSIS DIPSTICK
Bilirubin, UA: NEGATIVE
Blood, UA: NEGATIVE
Glucose, UA: NEGATIVE
Ketones, UA: NEGATIVE
Leukocytes, UA: NEGATIVE
Nitrite, UA: NEGATIVE
Protein, UA: NEGATIVE
Spec Grav, UA: 1.015 (ref 1.010–1.025)
pH, UA: 5 (ref 5.0–8.0)

## 2020-04-14 LAB — POCT WET PREP WITH KOH
Clue Cells Wet Prep HPF POC: POSITIVE
KOH Prep POC: POSITIVE — AB
Trichomonas, UA: NEGATIVE
Yeast Wet Prep HPF POC: NEGATIVE

## 2020-04-14 MED ORDER — METRONIDAZOLE 500 MG PO TABS: 500.0000 mg | ORAL_TABLET | Freq: Two times a day (BID) | ORAL | 0 refills | Status: DC

## 2020-04-14 NOTE — Patient Instructions (Signed)
I value your feedback and entrusting us with your care. If you get a [facility] patient survey, I would appreciate you taking the time to let us know about your experience today. Thank you!  As of October 17, 2019, your lab results will be released to your MyChart immediately, before I even have a chance to see them. Please give me time to review them and contact you if there are any abnormalities. Thank you for your patience.  

## 2020-04-14 NOTE — Progress Notes (Signed)
Robin Market, MD   Chief Complaint  Patient presents with   Vaginal Discharge    yellow, sour odor, no itchiness/irritation x 4 days   Urinary Tract Infection    some burning when urinating x 1 week    HPI:      [name] is a 24 y.o. G0P0000 whose LMP was Patient's last menstrual period was 03/26/2020 (approximate)., presents today for increased vag d/c with odor, no irritation, for the past few days. Initially had irritation, too but treated with terazol 04/03/20 with sx relief. Hx of recurrent yeast vag sx, usually before menses. Also with dysuria for the past wk. No urgency/LBP, pelvic pain, fevers, hematuria. She is sex active with new partner. Using OCPs, no condoms.   Hx of migraines. Went to ED 04/12/20 due to sx. Received toradol inj with temp relief. Sx recurred today. Suggested pt f/u with PCP.  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   Reflux    Syncope    Vitamin D deficiency    Wrist pain     Past Surgical History:  Procedure Laterality Date   DIAGNOSTIC LAPAROSCOPY     GANGLION CYST EXCISION Left 12/14/2015   Procedure: REMOVAL GANGLION OF WRIST;  Surgeon: Earnestine Leys, MD;  Location: [facility] ORS;  Service: Orthopedics;  Laterality: Left;   laporoscopic abdomen      Family History  Problem Relation Age of Onset   Stroke Other    Heart disease Other    Cancer Other    Kidney disease Neg Hx    Bladder Cancer Neg Hx     Social History   Socioeconomic History   Marital status: Single    Spouse name: Not on file   Number of children: Not on file   Years of education: Not on file   Highest education level:  Not on file  Occupational History   Not on file  Tobacco Use   Smoking status: Never Smoker   Smokeless tobacco: Never Used  Substance and Sexual Activity   Alcohol use: No   Drug use: No   Sexual activity: Yes    Birth control/protection: Pill  Other Topics Concern   Not on file  Social History Narrative   Not on file   Social Determinants of Health   Financial Resource Strain:    Difficulty of Paying Living Expenses:   Food Insecurity:    Worried About Charity fundraiser in the Last Year:    Arboriculturist in the Last Year:   Transportation Needs:    Film/video editor (Medical):    Lack of Transportation (Non-Medical):   Physical Activity:    Days of Exercise per Week:    Minutes of Exercise per Session:   Stress:    Feeling of Stress :   Social Connections:    Frequency of Communication with Friends and Family:    Frequency of Social Gatherings with Friends and Family:    Attends Religious Services:    Active Member of Clubs or Organizations:    Attends Archivist Meetings:  Marital Status:   Intimate Partner Violence:    Fear of Current or Ex-Partner:    Emotionally Abused:    Physically Abused:    Sexually Abused:     Outpatient Medications Prior to Visit  Medication Sig Dispense Refill   buPROPion (WELLBUTRIN XL) 150 MG 24 hr tablet Take 150 mg by mouth daily.     meloxicam (MOBIC) 7.5 MG tablet Take 7.5 mg by mouth 2 (two) times daily as needed.     Norgestimate-Ethinyl Estradiol Triphasic (TRI-SPRINTEC) 0.18/0.215/0.25 MG-35 MCG tablet Take 1 tablet by mouth daily.     pantoprazole (PROTONIX) 40 MG tablet Take 40 mg by mouth daily.     traZODone (DESYREL) 50 MG tablet Take 50 mg by mouth at bedtime.     ibuprofen (ADVIL) 600 MG tablet Take 1 tablet (600 mg total) by mouth every 6 (six) hours as needed. 30 tablet 0   Norgestimate-Ethinyl Estradiol Triphasic (TRI-SPRINTEC) 0.18/0.215/0.25 MG-35 MCG tablet  Take 1 tablet by mouth daily. Reported on 11/11/2015     No facility-administered medications prior to visit.      ROS:  Review of Systems  Constitutional: Negative for fever.  Gastrointestinal: Negative for blood in stool, constipation, diarrhea, nausea and vomiting.  Genitourinary: Positive for dysuria, frequency and vaginal discharge. Negative for dyspareunia, flank pain, hematuria, urgency, vaginal bleeding and vaginal pain.  Musculoskeletal: Negative for back pain.  Skin: Negative for rash.  Neurological: Positive for headaches.  BREAST: No symptoms   OBJECTIVE:   Vitals:  BP 120/70    Ht 5\' 3"  (1.6 m)    Wt 137 lb (62.1 kg)    LMP 03/26/2020 (Approximate)    BMI 24.27 kg/m   Physical Exam Vitals reviewed.  Constitutional:      Appearance: She is well-developed.  Pulmonary:     Effort: Pulmonary effort is normal.  Genitourinary:    General: Normal vulva.     Pubic Area: No rash.      Labia:        Right: No rash, tenderness or lesion.        Left: No rash, tenderness or lesion.      Vagina: Vaginal discharge present. No erythema or tenderness.     Cervix: Normal.     Uterus: Normal. Not enlarged and not tender.      Adnexa: Right adnexa normal and left adnexa normal.       Right: No mass or tenderness.         Left: No mass or tenderness.    Musculoskeletal:        General: Normal range of motion.     Cervical back: Normal range of motion.  Skin:    General: Skin is warm and dry.  Neurological:     General: No focal deficit present.     Mental Status: She is alert and oriented to person, place, and time.  Psychiatric:        Mood and Affect: Mood normal.        Behavior: Behavior normal.        Thought Content: Thought content normal.        Judgment: Judgment normal.     Results: Results for orders placed or performed in visit on 04/14/20 (from the past 24 hour(s))  POCT Wet Prep with KOH     Status: Abnormal   Collection Time: 04/14/20 10:16 AM   Result Value Ref Range   Trichomonas, UA Negative    Clue Cells Wet Prep  HPF POC pos    Epithelial Wet Prep HPF POC     Yeast Wet Prep HPF POC neg    Bacteria Wet Prep HPF POC     RBC Wet Prep HPF POC     WBC Wet Prep HPF POC     KOH Prep POC Positive (A) Negative  POCT Urinalysis Dipstick     Status: Normal   Collection Time: 04/14/20 10:16 AM  Result Value Ref Range   Color, UA yellow    Clarity, UA clear    Glucose, UA Negative Negative   Bilirubin, UA neg    Ketones, UA neg    Spec Grav, UA 1.015 1.010 - 1.025   Blood, UA neg    pH, UA 5.0 5.0 - 8.0   Protein, UA Negative Negative   Urobilinogen, UA     Nitrite, UA neg    Leukocytes, UA Negative Negative   Appearance     Odor       Assessment/Plan: Bacterial vaginosis - Plan: metroNIDAZOLE (FLAGYL) 500 MG tablet, POCT Wet Prep with KOH; Pos sx and wet prep. Rx flagyl. No EtOH. F/u prn. Will RF if sx recur.  Dysuria - Plan: POCT Urinalysis Dipstick; neg UA, no other UTI sx. Most likely due to BV. F/u prn.   Screening for STD (sexually transmitted disease) - Plan: Cervicovaginal ancillary only   Meds ordered this encounter  Medications   metroNIDAZOLE (FLAGYL) 500 MG tablet    Sig: Take 1 tablet (500 mg total) by mouth 2 (two) times daily for 7 days.    Dispense:  14 tablet    Refill:  0    Order Specific Question:   Supervising Provider    Answer:   Nadara Mustard [[id number]]      Return if symptoms worsen or fail to improve.  Robin Christenberry B. Analicia Skibinski, PA-C 04/14/2020 10:18 AM

## 2020-04-15 LAB — CERVICOVAGINAL ANCILLARY ONLY
Chlamydia: NEGATIVE
Comment: NEGATIVE
Comment: NORMAL
Neisseria Gonorrhea: NEGATIVE

## 2020-05-13 ENCOUNTER — Encounter: Payer: Self-pay | Admitting: Emergency Medicine

## 2020-05-13 ENCOUNTER — Other Ambulatory Visit: Payer: Self-pay

## 2020-05-13 ENCOUNTER — Emergency Department
Admission: EM | Admit: 2020-05-13 | Discharge: 2020-05-13 | Disposition: A | Discharge status: Home / Self Care | Payer: Self-pay | Attending: Emergency Medicine | Admitting: Emergency Medicine

## 2020-05-13 DIAGNOSIS — R21 Rash and other nonspecific skin eruption: Secondary | ICD-10-CM | POA: Insufficient documentation

## 2020-05-13 DIAGNOSIS — W57XXXA Bitten or stung by nonvenomous insect and other nonvenomous arthropods, initial encounter: Secondary | ICD-10-CM | POA: Insufficient documentation

## 2020-05-13 DIAGNOSIS — J45909 Unspecified asthma, uncomplicated: Secondary | ICD-10-CM | POA: Insufficient documentation

## 2020-05-13 MED ORDER — TRIAMCINOLONE ACETONIDE 0.5 % EX OINT
1.0000 | TOPICAL_OINTMENT | Freq: Two times a day (BID) | CUTANEOUS | 0 refills | Status: DC
Start: 2020-05-13 — End: 2021-01-12

## 2020-05-13 NOTE — ED Triage Notes (Signed)
Rash  

## 2020-05-13 NOTE — ED Provider Notes (Signed)
[facility] Emergency Department Provider Note  ____________________________________________  Time seen: Approximately 3:40 PM  I have reviewed the triage vital signs and the nursing notes.   HISTORY  Chief Complaint Rash    HPI Robin Mccall is a 24 y.o. female that presents to the emergency department for evaluation of rash for 2 to 3 days.  Patient has a few small spots to her arms, chest, back, legs.  She does not recall being bitten by anything.  She did think of " the neighborhood dog"in her car the day prior.  No one else has a rash.  She has not switched any lotions or detergents.  She has not started any new medications.  No new foods.  No tick bites.   Past Medical History:  Diagnosis Date  . Abdominal pain   . Allergic rhinitis   . Asthma   . Dermatitis   . Dysmenorrhea   . Glucose found in urine on examination    seeing nephrologist on 12-07-15-pt unsure of what the MD's name is   . HA (headache)    migraines-related to stress  . HLD (hyperlipidemia)   . Hydronephrosis   . 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 Reflux   . Syncope   . Vitamin D deficiency   . Wrist pain     Patient Active Problem List   Diagnosis Date Noted  . Bacterial vaginosis 04/14/2020  . Microscopic hematuria 02/12/2016  . 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 Dyspareunia in female 02/12/2016  . 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 DIAGNOSTIC LAPAROSCOPY    . GANGLION CYST EXCISION Left 12/14/2015   Procedure: REMOVAL GANGLION OF WRIST;  Surgeon: Deeann Saint, MD;  Location: [facility] ORS;  Service: Orthopedics;  Laterality: Left;  . laporoscopic abdomen      Prior to Admission medications   Medication Sig Start Date End Date Taking? Authorizing Provider  buPROPion (WELLBUTRIN XL) 150 MG 24 hr tablet Take 150 mg by mouth daily. 04/08/20   [provider]  meloxicam (MOBIC) 7.5 MG tablet Take 7.5 mg by mouth 2 (two) times daily as  needed. 04/09/20   [provider]  Norgestimate-Ethinyl Estradiol Triphasic (TRI-SPRINTEC) 0.18/0.215/0.25 MG-35 MCG tablet Take 1 tablet by mouth daily.    [provider]  pantoprazole (PROTONIX) 40 MG tablet Take 40 mg by mouth daily. 03/27/20   [provider]  traZODone (DESYREL) 50 MG tablet Take 50 mg by mouth at bedtime. 03/26/20   [provider]  triamcinolone ointment (KENALOG) 0.5 % Apply 1 application topically 2 (two) times daily. 05/13/20   Enid Derry, PA-C    Allergies Patient has no known allergies.  Family History  Problem Relation Age of Onset  . Stroke Other   . Heart disease Other   . Cancer Other   . Kidney disease Neg Hx   . Bladder Cancer Neg Hx     Social History Social History   Tobacco Use  . Smoking status: Never Smoker  . Smokeless tobacco: Never Used  Vaping Use  . Vaping Use: Never used  Substance Use Topics  . Alcohol use: No  . Drug use: No     Review of Systems  Constitutional: No fever/chills Respiratory:  No SOB. Gastrointestinal: No abdominal pain.  No nausea, no vomiting.  Musculoskeletal: Negative for musculoskeletal pain. Skin: Negative for abrasions, lacerations, ecchymosis. Positive for rash. Neurological: Negative for headaches, numbness or tingling   ____________________________________________   PHYSICAL EXAM:  VITAL SIGNS: ED Triage Vitals  Enc Vitals Group     BP 05/13/20 1430 119/65     Pulse Rate 05/13/20 1430 83     Resp 05/13/20 1430 17     Temp 05/13/20 1430 98.5 F (36.9 C)     Temp Source 05/13/20 1430 Oral     SpO2 05/13/20 1430 100 %     Weight 05/13/20 1412 136 lb 14.5 oz (62.1 kg)     Height 05/13/20 1412 5\' 3"  (1.6 m)     Head Circumference --      Peak Flow --      Pain Score 05/13/20 1412 0     Pain Loc --      Pain Edu? --      Excl. in GC? --      Constitutional: Alert and oriented. Well appearing and in no acute distress. Eyes: Conjunctivae are  normal. PERRL. EOMI. Head: Atraumatic. ENT:      Ears:      Nose: No congestion/rhinnorhea.      Mouth/Throat: Mucous membranes are moist.  Neck: No stridor.  Cardiovascular: Normal rate, regular rhythm.  Good peripheral circulation. Respiratory: Normal respiratory effort without tachypnea or retractions. Lungs CTAB. Good air entry to the bases with no decreased or absent breath sounds. Musculoskeletal: Full range of motion to all extremities. No gross deformities appreciated. Neurologic:  Normal speech and language. No gross focal neurologic deficits are appreciated.  Skin:  Skin is warm, dry and intact. Few small pink papules scattered to arms, legs, chest, back. Psychiatric: Mood and affect are normal. Speech and behavior are normal. Patient exhibits appropriate insight and judgement.   ____________________________________________   LABS (all labs ordered are listed, but only abnormal results are displayed)  Labs Reviewed - No data to display ____________________________________________  EKG   ____________________________________________  RADIOLOGY  No results found.  ____________________________________________    PROCEDURES  Procedure(s) performed:    Procedures    Medications - No data to display   ____________________________________________   INITIAL IMPRESSION / ASSESSMENT AND PLAN / ED COURSE  Pertinent labs & imaging results that were available during my care of the patient were reviewed by me and considered in my medical decision making (see chart for details).  Review of the [place] CSRS was performed in accordance of the [facility] prior to dispensing any controlled drugs.   Patient's diagnosis is most consistent with insect bite. Patient will be discharged home with prescriptions for triamcinalone. Patient is to follow up with PCP as directed. Patient is given ED precautions to return to the ED for any worsening or new symptoms.   Robin Mccall was  evaluated in Emergency Department on 05/13/2020 for the symptoms described in the history of present illness. She was evaluated in the context of the global COVID-19 pandemic, which necessitated consideration that the patient might be at risk for infection with the SARS-CoV-2 virus that causes COVID-19. Institutional protocols and algorithms that pertain to the evaluation of patients at risk for COVID-19 are in a state of rapid change based on information released by regulatory bodies including the CDC and federal and state organizations. These policies and algorithms were followed during the patient's care in the ED.  ____________________________________________  FINAL CLINICAL IMPRESSION(S) / ED DIAGNOSES  Final diagnoses:  Rash  Insect bite, unspecified site, initial encounter      NEW MEDICATIONS STARTED DURING THIS VISIT:  ED Discharge Orders         Ordered  triamcinolone ointment (KENALOG) 0.5 %  2 times daily     Discontinue  Reprint     05/13/20 1549              This chart was dictated using voice recognition software/Dragon. Despite best efforts to proofread, errors can occur which can change the meaning. Any change was purely unintentional.    Enid Derry, PA-C 05/13/20 2359    Arnaldo Natal, MD 05/14/20 [phone]

## 2020-05-28 ENCOUNTER — Other Ambulatory Visit: Payer: Self-pay | Admitting: Obstetrics and Gynecology

## 2020-05-28 ENCOUNTER — Telehealth: Payer: Self-pay

## 2020-05-28 DIAGNOSIS — B9689 Other specified bacterial agents as the cause of diseases classified elsewhere: Secondary | ICD-10-CM

## 2020-05-28 MED ORDER — METRONIDAZOLE 500 MG PO TABS: 500.0000 mg | ORAL_TABLET | Freq: Two times a day (BID) | ORAL | 0 refills | Status: AC

## 2020-05-28 NOTE — Telephone Encounter (Signed)
Rx flagyl eRxd. F/u prn.

## 2020-05-28 NOTE — Progress Notes (Signed)
Rx RF flagyl for BV sx.  

## 2020-05-28 NOTE — Telephone Encounter (Signed)
Pt calling for rx for inf - thinks it's the same as last time.  [phone]  Pt's sxs are d/c - more than normal, the smell is off like it was last time, and she had some pain with it yesterday.

## 2020-05-28 NOTE — Telephone Encounter (Signed)
Pt aware.

## 2020-09-10 ENCOUNTER — Telehealth: Payer: Self-pay

## 2020-09-10 NOTE — Telephone Encounter (Signed)
Called pt to get more info, no answer, LVMTRC.

## 2020-09-10 NOTE — Telephone Encounter (Signed)
Pls cont to get sx from pt in [place] absence tomorrow. Hx of BV and yeast in past.

## 2020-09-10 NOTE — Telephone Encounter (Signed)
Pt calling for yeast inf rx.  [phone]

## 2020-09-11 NOTE — Telephone Encounter (Signed)
Pt returned call; I wasn't able to talk to her at that time; [facility] and leave msg on nurse line as to what exactly her sxs are.

## 2020-09-11 NOTE — Telephone Encounter (Signed)
Pt calling; has burning c wiping after using restroom; slight d/c; requesting rx for yeast inf.  [phone]

## 2020-09-14 NOTE — Telephone Encounter (Signed)
Tried again, no answer, LVMTRC. 

## 2020-09-14 NOTE — Telephone Encounter (Signed)
Pls call pt to see if she's still having sx. Any fishy odor?

## 2020-09-14 NOTE — Telephone Encounter (Signed)
Called pt, no answer, LVMTRC. 

## 2020-10-23 NOTE — Telephone Encounter (Signed)
No response.  msg closed.

## 2020-11-04 ENCOUNTER — Ambulatory Visit: Payer: Medicaid Other | Admitting: Obstetrics and Gynecology

## 2021-01-12 ENCOUNTER — Ambulatory Visit (INDEPENDENT_AMBULATORY_CARE_PROVIDER_SITE_OTHER): Payer: Managed Care, Other (non HMO) | Admitting: Obstetrics

## 2021-01-12 ENCOUNTER — Other Ambulatory Visit: Payer: Self-pay

## 2021-01-12 ENCOUNTER — Other Ambulatory Visit (HOSPITAL_COMMUNITY)
Admission: RE | Admit: 2021-01-12 | Discharge: 2021-01-12 | Disposition: A | Discharge status: Home / Self Care | Payer: Managed Care, Other (non HMO) | Source: Ambulatory Visit | Attending: Obstetrics | Admitting: Obstetrics

## 2021-01-12 VITALS — BP 118/74 | Ht 63.0 in | Wt 123.0 lb

## 2021-01-12 DIAGNOSIS — N898 Other specified noninflammatory disorders of vagina: Secondary | ICD-10-CM | POA: Insufficient documentation

## 2021-01-12 DIAGNOSIS — Z113 Encounter for screening for infections with a predominantly sexual mode of transmission: Secondary | ICD-10-CM | POA: Insufficient documentation

## 2021-01-12 DIAGNOSIS — R300 Dysuria: Secondary | ICD-10-CM

## 2021-01-12 LAB — POCT URINALYSIS DIPSTICK
Bilirubin, UA: NEGATIVE
Blood, UA: NEGATIVE
Glucose, UA: NEGATIVE
Ketones, UA: NEGATIVE
Leukocytes, UA: NEGATIVE
Nitrite, UA: NEGATIVE
Protein, UA: NEGATIVE
Spec Grav, UA: 1.02 (ref 1.010–1.025)
Urobilinogen, UA: NEGATIVE E.U./dL — AB
pH, UA: 7 (ref 5.0–8.0)

## 2021-01-12 NOTE — Progress Notes (Signed)
[name] is a 25 y.o. G0P0000 who LMP was Patient's last menstrual period was 01/01/2021., presents today for a problem visit.  She complains of burning with urination . She has had symptoms for 2 days. Patient also complains of vaginal discharge. Symptoms are mild.  Patient denies back pain. Patient does not have a history of recurrent UTI,  does not have a history of pyelonephritis, does not have a history of nephrolithiasis.  Her mother does a have a hx of renal calculi however.She has not had previous treatment for her current symptoms.  She did have a visit for this same complaints several months ago, and hematuria was noted at that time. The urine culture did not grow out bacteria however. She reports that the sxs today are similar.  She contracepts with OCPs. She shares that she has recently had IC with a new sexual partner, and requests some STI screening at this visit.  Review of Systems  Constitutional: Negative.   HENT: Negative.   Eyes: Negative.   Respiratory: Negative.   Cardiovascular: Negative.   Gastrointestinal: Negative.   Genitourinary: Positive for dysuria and hematuria.  Musculoskeletal: Negative.   Skin: Negative.   Neurological: Negative.   Endo/Heme/Allergies: Negative.   Psychiatric/Behavioral: Negative.     Physical Exam Constitutional:      Appearance: Normal appearance. She is normal weight.  Genitourinary:     Vulva normal.     Genitourinary Comments: Normal female genitalia. Shaves entire mons and perineal area. No noticeable vaginal discharge. No lesions or rashes. Normal appearing nulliparous cervix  Urind dip shows 3+ blood, no leuks or nitrites or bacteria. Culture pending  HENT:     Head: Normocephalic and atraumatic.     Nose: Nose normal.  Cardiovascular:     Rate and Rhythm: Normal rate and regular rhythm.  Pulmonary:     Effort: Pulmonary effort is normal.     Breath sounds: Normal breath sounds.  Abdominal:     General: Bowel  sounds are normal.     Palpations: Abdomen is soft.  Musculoskeletal:        General: Normal range of motion.     Cervical back: Normal range of motion and neck supple.  Neurological:     General: No focal deficit present.     Mental Status: She is alert and oriented to person, place, and time.  Skin:    General: Skin is warm.  Psychiatric:        Mood and Affect: Mood normal.        Behavior: Behavior normal.   See urine dip- hematuria.   A: UTI Symptoms Hematuria of unknown etiology. New sexual partner- STI screening for GC and CZ requested  P: Aptima swab sent Urine culture sent to lab Will await the culture and treat accordingly. Advised to start on AZO or Uro stat for symptoms. Consider renal ultrasound and uro consult based on findings.  Condoms use advised.  Mirna Mires, CNM  01/12/2021 5:39 PM

## 2021-01-14 LAB — CERVICOVAGINAL ANCILLARY ONLY
Bacterial Vaginitis (gardnerella): NEGATIVE
Candida Glabrata: NEGATIVE
Candida Vaginitis: NEGATIVE
Chlamydia: NEGATIVE
Comment: NEGATIVE
Comment: NEGATIVE
Comment: NEGATIVE
Comment: NEGATIVE
Comment: NORMAL
Neisseria Gonorrhea: NEGATIVE

## 2021-01-15 LAB — URINE CULTURE

## 2021-01-21 ENCOUNTER — Encounter: Payer: Self-pay | Admitting: Obstetrics

## 2021-01-28 ENCOUNTER — Other Ambulatory Visit: Payer: Self-pay

## 2021-01-28 ENCOUNTER — Ambulatory Visit (INDEPENDENT_AMBULATORY_CARE_PROVIDER_SITE_OTHER): Payer: Managed Care, Other (non HMO) | Admitting: Podiatry

## 2021-01-28 ENCOUNTER — Encounter: Payer: Self-pay | Admitting: Podiatry

## 2021-01-28 DIAGNOSIS — L600 Ingrowing nail: Secondary | ICD-10-CM

## 2021-01-28 DIAGNOSIS — L603 Nail dystrophy: Secondary | ICD-10-CM

## 2021-01-28 NOTE — Progress Notes (Signed)
Subjective:  Patient ID: Robin Mccall, female    DOB: [birth year],  MRN: [id number]  Chief Complaint  Patient presents with  . Nail Problem    Patient presents today for left great toenail growing into skin again.  She has had toenail removed several times and it keep growing back and into her nail bed causing a lot of pain    25 y.o. female presents with the above complaint.  Patient presents with multiple history of previous removal of the right great toenail as well as contusion to the nail.  Patient states that this has been going on for many years.  It has never grown the same way is causing her a lot of pain.  She has had a removed in the past.  She would like to remove it again at this time make it permanent.  She has never had a phenol matricectomy performed.  She denies any other acute complaints.  She has not seen anyone else prior to seeing me.   Review of Systems: Negative except as noted in the HPI. Denies N/V/F/Ch.  Past Medical History:  Diagnosis Date  . Abdominal pain   . Allergic rhinitis   . Asthma   . Dermatitis   . Dysmenorrhea   . Glucose found in urine on examination    seeing nephrologist on 12-07-15-pt unsure of what the MD's name is   . HA (headache)    migraines-related to stress  . HLD (hyperlipidemia)   . Hydronephrosis   . 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 Reflux   . Syncope   . Vitamin D deficiency   . Wrist pain     Current Outpatient Medications:  .  Norgestimate-Ethinyl Estradiol Triphasic 0.18/0.215/0.25 MG-35 MCG tablet, Take 1 tablet by mouth daily., Disp: , Rfl:   Social History   Tobacco Use  Smoking Status Never Smoker  Smokeless Tobacco Never Used    No Known Allergies Objective:  There were no vitals filed for this visit. There is no height or weight on file to calculate BMI. Constitutional Well developed. Well nourished.  Vascular Dorsalis pedis pulses palpable bilaterally. Posterior tibial pulses palpable  bilaterally. Capillary refill normal to all digits.  No cyanosis or clubbing noted. Pedal hair growth normal.  Neurologic Normal speech. Oriented to person, place, and time. Epicritic sensation to light touch grossly present bilaterally.  Dermatologic Pain on palpation of the entire/total nail on 1st digit of the left No other open wounds. No skin lesions.  Orthopedic: Normal joint ROM without pain or crepitus bilaterally. No visible deformities. No bony tenderness.   Radiographs: None Assessment:   1. Ingrown left big toenail   2. Nail dystrophy    Plan:  Patient was evaluated and treated and all questions answered.  Nail contusion/dystrophy hallux, left with phenol matricectomy -Patient elects to proceed with minor surgery to remove entire toenail today. Consent reviewed and signed by patient. -Entire/total nail excised. See procedure note. -Educated on post-procedure care including soaking. Written instructions provided and reviewed. -Patient to follow up in 2 weeks for nail check.  Procedure: Excision of entire/total nail with phenol matricectomy Location: Left 1st toe digit Anesthesia: Lidocaine 1% plain; 1.5 mL and Marcaine 0.5% plain; 1.5 mL, digital block. Skin Prep: Betadine. Dressing: Silvadene; telfa; dry, sterile, compression dressing. Technique: Following skin prep, the toe was exsanguinated and a tourniquet was secured at the base of the toe. The affected nail border was freed and excised.  Phenol was applied to the entirety of the matrix  to remove the matricectomy.  The tourniquet was then removed and sterile dressing applied. Disposition: Patient tolerated procedure well. Patient to return in 2 weeks for follow-up.   No follow-ups on file.

## 2021-01-28 NOTE — Patient Instructions (Signed)

## 2022-02-02 ENCOUNTER — Ambulatory Visit: Payer: Managed Care, Other (non HMO) | Admitting: Obstetrics and Gynecology

## 2022-02-03 ENCOUNTER — Telehealth: Payer: Self-pay

## 2022-02-03 ENCOUNTER — Other Ambulatory Visit: Payer: Self-pay | Admitting: Obstetrics and Gynecology

## 2022-02-03 DIAGNOSIS — N760 Acute vaginitis: Secondary | ICD-10-CM

## 2022-02-03 MED ORDER — FLUCONAZOLE 150 MG PO TABS: 150.0000 mg | ORAL_TABLET | Freq: Once | ORAL | 0 refills | Status: DC

## 2022-02-03 NOTE — Telephone Encounter (Signed)
Rx eRxd. Pls notify pt she is due for annual and pap, if not getting it elsewhere. Thx.

## 2022-02-03 NOTE — Progress Notes (Signed)
Rx diflucan for yeast vag sx.  

## 2022-02-03 NOTE — Telephone Encounter (Signed)
Left detailed msg. (Pt already scheduled for annual in April) ?

## 2022-02-03 NOTE — Telephone Encounter (Signed)
Pt calling for rx for yeast inf; has no availability for an appt; has been done before.  [phone]   ?

## 2022-02-20 ENCOUNTER — Other Ambulatory Visit: Payer: Self-pay

## 2022-02-20 ENCOUNTER — Encounter: Payer: Self-pay | Admitting: Emergency Medicine

## 2022-02-20 ENCOUNTER — Emergency Department
Admission: EM | Admit: 2022-02-20 | Discharge: 2022-02-20 | Disposition: A | Discharge status: Home / Self Care | Payer: Managed Care, Other (non HMO) | Attending: Emergency Medicine | Admitting: Emergency Medicine

## 2022-02-20 ENCOUNTER — Emergency Department: Payer: Managed Care, Other (non HMO)

## 2022-02-20 DIAGNOSIS — Y92410 Unspecified street and highway as the place of occurrence of the external cause: Secondary | ICD-10-CM | POA: Insufficient documentation

## 2022-02-20 DIAGNOSIS — S300XXA Contusion of lower back and pelvis, initial encounter: Secondary | ICD-10-CM | POA: Diagnosis present

## 2022-02-20 DIAGNOSIS — R519 Headache, unspecified: Secondary | ICD-10-CM | POA: Diagnosis not present

## 2022-02-20 DIAGNOSIS — S40022A Contusion of left upper arm, initial encounter: Secondary | ICD-10-CM | POA: Diagnosis not present

## 2022-02-20 DIAGNOSIS — S8011XA Contusion of right lower leg, initial encounter: Secondary | ICD-10-CM | POA: Insufficient documentation

## 2022-02-20 DIAGNOSIS — S199XXA Unspecified injury of neck, initial encounter: Secondary | ICD-10-CM | POA: Insufficient documentation

## 2022-02-20 DIAGNOSIS — S79912A Unspecified injury of left hip, initial encounter: Secondary | ICD-10-CM | POA: Insufficient documentation

## 2022-02-20 DIAGNOSIS — M7918 Myalgia, other site: Secondary | ICD-10-CM

## 2022-02-20 DIAGNOSIS — S8012XA Contusion of left lower leg, initial encounter: Secondary | ICD-10-CM | POA: Insufficient documentation

## 2022-02-20 DIAGNOSIS — S40021A Contusion of right upper arm, initial encounter: Secondary | ICD-10-CM | POA: Diagnosis not present

## 2022-02-20 DIAGNOSIS — S79911A Unspecified injury of right hip, initial encounter: Secondary | ICD-10-CM | POA: Diagnosis not present

## 2022-02-20 LAB — URINALYSIS, ROUTINE W REFLEX MICROSCOPIC
Bilirubin Urine: NEGATIVE
Glucose, UA: 150 mg/dL — AB
Hgb urine dipstick: NEGATIVE
Ketones, ur: NEGATIVE mg/dL
Leukocytes,Ua: NEGATIVE
Nitrite: NEGATIVE
Protein, ur: NEGATIVE mg/dL
Specific Gravity, Urine: 1.025 (ref 1.005–1.030)
pH: 6 (ref 5.0–8.0)

## 2022-02-20 LAB — POC URINE PREG, ED: Preg Test, Ur: NEGATIVE
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
## 2022-02-20 NOTE — Discharge Instructions (Addendum)
Follow-up with your primary care provider if any continued problems or concerns.  You may take Tylenol or ibuprofen as needed for pain, soreness or stiffness.  X-rays today are negative for any acute injury.  Urinalysis was negative for blood.  You may use ice or heat to your muscles as needed for discomfort.  Continue to walk around as tolerated to prevent continued soreness and stiffness. ?
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
## 2022-02-23 ENCOUNTER — Other Ambulatory Visit: Payer: Self-pay | Admitting: Obstetrics and Gynecology

## 2022-02-23 ENCOUNTER — Telehealth: Payer: Self-pay

## 2022-02-23 DIAGNOSIS — N760 Acute vaginitis: Secondary | ICD-10-CM

## 2022-02-23 MED ORDER — FLUCONAZOLE 150 MG PO TABS: 150.0000 mg | ORAL_TABLET | Freq: Once | ORAL | 0 refills | Status: AC

## 2022-02-23 NOTE — Telephone Encounter (Signed)
Rx diflucan eRxd. Pls notify pt

## 2022-02-23 NOTE — Telephone Encounter (Signed)
Please let me know when pt callsback ?

## 2022-02-23 NOTE — Telephone Encounter (Signed)
Pt states she is having vaginal itching, burning, d/c that started 2 days ago. Do you want her to schedule an appt or would you send in RX? Looks like she gets BV frequently.  ?

## 2022-02-23 NOTE — Progress Notes (Signed)
Rx RF diflucan for yeast vag sx 

## 2022-02-23 NOTE — Telephone Encounter (Signed)
Patient is aware 

## 2022-02-23 NOTE — Telephone Encounter (Signed)
Pt called triage stating she thought she had a yeast infection. Tried to call to get more info and symptoms. Please let me know when she calls back. She may need an appt ?

## 2022-03-01 ENCOUNTER — Ambulatory Visit (INDEPENDENT_AMBULATORY_CARE_PROVIDER_SITE_OTHER): Payer: Managed Care, Other (non HMO) | Admitting: Obstetrics and Gynecology

## 2022-03-01 ENCOUNTER — Other Ambulatory Visit (HOSPITAL_COMMUNITY)
Admission: RE | Admit: 2022-03-01 | Discharge: 2022-03-01 | Disposition: A | Discharge status: Home / Self Care | Payer: Managed Care, Other (non HMO) | Source: Ambulatory Visit | Attending: Obstetrics and Gynecology | Admitting: Obstetrics and Gynecology

## 2022-03-01 ENCOUNTER — Encounter: Payer: Self-pay | Admitting: Obstetrics and Gynecology

## 2022-03-01 VITALS — BP 104/60 | Ht 63.0 in | Wt 132.0 lb

## 2022-03-01 DIAGNOSIS — Z124 Encounter for screening for malignant neoplasm of cervix: Secondary | ICD-10-CM | POA: Insufficient documentation

## 2022-03-01 DIAGNOSIS — Z113 Encounter for screening for infections with a predominantly sexual mode of transmission: Secondary | ICD-10-CM | POA: Diagnosis present

## 2022-03-01 DIAGNOSIS — N898 Other specified noninflammatory disorders of vagina: Secondary | ICD-10-CM | POA: Diagnosis not present

## 2022-03-01 DIAGNOSIS — R350 Frequency of micturition: Secondary | ICD-10-CM

## 2022-03-01 DIAGNOSIS — Z01419 Encounter for gynecological examination (general) (routine) without abnormal findings: Secondary | ICD-10-CM

## 2022-03-01 DIAGNOSIS — Z3041 Encounter for surveillance of contraceptive pills: Secondary | ICD-10-CM

## 2022-03-01 LAB — POCT URINALYSIS DIPSTICK
Bilirubin, UA: NEGATIVE
Glucose, UA: NEGATIVE
Ketones, UA: NEGATIVE
Leukocytes, UA: NEGATIVE
Nitrite, UA: NEGATIVE
Protein, UA: NEGATIVE
Spec Grav, UA: 1.025 (ref 1.010–1.025)
pH, UA: 5 (ref 5.0–8.0)

## 2022-03-01 LAB — POCT WET PREP WITH KOH
Clue Cells Wet Prep HPF POC: NEGATIVE
KOH Prep POC: NEGATIVE
Trichomonas, UA: NEGATIVE
Yeast Wet Prep HPF POC: NEGATIVE

## 2022-03-01 MED ORDER — FLUCONAZOLE 150 MG PO TABS
150.0000 mg | ORAL_TABLET | Freq: Once | ORAL | 0 refills | Status: AC
Start: 2022-03-01 — End: 2022-03-01

## 2022-03-01 MED ORDER — NORGESTIM-ETH ESTRAD TRIPHASIC 0.18/0.215/0.25 MG-35 MCG PO TABS: 1.0000 | ORAL_TABLET | Freq: Every day | ORAL | 3 refills | Status: DC

## 2022-03-01 NOTE — Progress Notes (Signed)
? ?PCP:  Evelene Croon, MD ? ? ?Chief Complaint  ?Patient presents with  ? Gynecologic Exam  ?  Vag discomfort, no odor itchiness  ? ? ? ?HPI: ?     [name] is a 26 y.o. G0P0000 whose LMP was Patient's last menstrual period was 02/24/2022 (exact date)., presents today for her annual examination.  Her menses are regular every 28-30 days, lasting 3-4 days.  Dysmenorrhea mild. She does not have intermenstrual bleeding. ? ?Sex activity: single partner, contraception - OCP (estrogen/progesterone).  ?Last Pap: 04/16/19 Results were: no abnormalities  ?Hx of STDs: none ? ?Hx of yeast vag sx 3/23 and 4/23 after amox use, treated with diflucan twice with sx relief except still having pain externally with sex. Hx of BV in past. No increased d/c, odor now. Has had urinary frequency with and without good flow, no dysuria, LBP, pelvic pain, fevers, hematuria. Drinks 1-2 caffeinated drinks daily.  ? ?There is no FH of breast cancer. There is no FH of ovarian cancer. The patient does not do self-breast exams. ? ?Tobacco use: The patient denies current or previous tobacco use. ?Alcohol use: social drinker ?No drug use.  ?Exercise: moderately active ? ?She does get adequate calcium but not Vitamin D in her diet. ?Gardasil completed.  ? ?Patient Active Problem List  ? Diagnosis Date Noted  ? Bacterial vaginosis 04/14/2020  ? Microscopic hematuria 02/12/2016  ? 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 Dyspareunia in female 02/12/2016  ? 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 ? ?Past Surgical History:  ?Procedure Laterality Date  ? DIAGNOSTIC LAPAROSCOPY    ? GANGLION CYST EXCISION Left 12/14/2015  ? Procedure: REMOVAL GANGLION OF WRIST;  Surgeon: Deeann Saint, MD;  Location: [facility] ORS;  Service: Orthopedics;  Laterality: Left;  ? laporoscopic abdomen    ? ? ?Family History  ?Problem Relation Age of Onset  ? Stroke Maternal Aunt   ? Bone cancer Maternal Grandfather   ? Stomach cancer Maternal Great-grandfather   ? Kidney disease Neg Hx   ? Bladder  Cancer Neg Hx   ? ? ?Social History  ? ?Socioeconomic History  ? Marital status: Single  ?  Spouse name: Not on file  ? Number of children: Not on file  ? Years of education: Not on file  ? Highest education level: Not on file  ?Occupational History  ? Not on file  ?Tobacco Use  ? Smoking status: Never  ? Smokeless tobacco: Never  ?Vaping Use  ? Vaping Use: Never used  ?Substance and Sexual Activity  ? Alcohol use: No  ? Drug use: No  ? Sexual activity: Yes  ?  Birth control/protection: Pill  ?Other Topics Concern  ? Not on file  ?Social History Narrative  ? Not on file  ? ?Social Determinants of Health  ? ?Financial Resource Strain: Not on file  ?Food Insecurity: Not on file  ?Transportation Needs: Not on file  ?Physical Activity: Not on file  ?Stress: Not on file  ?Social Connections: Not on file  ?Intimate Partner Violence: Not on file  ? ? ? ?Current Outpatient Medications:  ?  ALPRAZolam (XANAX) 0.5 MG tablet, Take 0.5 mg by mouth at bedtime., Disp: , Rfl:  ?  ARIPiprazole (ABILIFY) 5 MG tablet, Take 5 mg by mouth daily., Disp: , Rfl:  ?  busPIRone (BUSPAR) 5 MG tablet, Take 5 mg by mouth 3 (three) times daily as needed., Disp: , Rfl:  ?  escitalopram (LEXAPRO) 20 MG tablet, Take 20 mg by mouth  daily., Disp: , Rfl:  ?  fluconazole (DIFLUCAN) 150 MG tablet, Take 1 tablet (150 mg total) by mouth once for 1 dose., Disp: 1 tablet, Rfl: 0 ?  topiramate (TOPAMAX) 25 MG tablet, Take 25 mg by mouth at bedtime., Disp: , Rfl:  ?  Norgestimate-Ethinyl Estradiol Triphasic 0.18/0.215/0.25 MG-35 MCG tablet, Take 1 tablet by mouth daily., Disp: 84 tablet, Rfl: 3 ? ? ? ? ?ROS: ? ?Review of Systems  ?Constitutional:  Negative for fatigue, fever and unexpected weight change.  ?Respiratory:  Negative for cough, shortness of breath and wheezing.   ?Cardiovascular:  Negative for chest pain, palpitations and leg swelling.  ?Gastrointestinal:  Negative for blood in stool, constipation, diarrhea, nausea and vomiting.  ?Endocrine:  Negative for cold intolerance, heat intolerance and polyuria.  ?Genitourinary:  Positive for dyspareunia and frequency. Negative for dysuria, flank pain, genital sores, hematuria, menstrual problem, pelvic pain, urgency, vaginal bleeding, vaginal discharge and vaginal pain.  ?Musculoskeletal:  Negative for back pain, joint swelling and myalgias.  ?Skin:  Negative for rash.  ?Neurological:  Negative for dizziness, syncope, light-headedness, numbness and headaches.  ?Hematological:  Negative for adenopathy.  ?Psychiatric/Behavioral:  Negative for agitation, confusion, sleep disturbance and suicidal ideas. The patient is not nervous/anxious.   ?BREAST: No symptoms ? ? ?Objective: ?BP 104/60   Ht 5\' 3"  (1.6 m)   Wt 132 lb (59.9 kg)   LMP 02/24/2022 (Exact Date)   BMI 23.38 kg/m?  ? ? ?Physical Exam ?Constitutional:   ?   Appearance: She is well-developed.  ?Genitourinary:  ?   Vulva normal.  ?   Genitourinary Comments: EXT TENDERNESS ON EXAM AT INTROITUS; NO LESIONS  ?   Right Labia: tenderness.  ?   Right Labia: No rash or lesions. ?   Left Labia: tenderness.  ?   Left Labia: No lesions or rash. ?   No vaginal discharge, erythema or tenderness.  ? ?   Right Adnexa: not tender and no mass present. ?   Left Adnexa: not tender and no mass present. ?   No cervical friability or polyp.  ?   Uterus is not enlarged or tender.  ?Breasts: ?   Right: No mass, nipple discharge, skin change or tenderness.  ?   Left: No mass, nipple discharge, skin change or tenderness.  ?Neck:  ?   Thyroid: No thyromegaly.  ?Cardiovascular:  ?   Rate and Rhythm: Normal rate and regular rhythm.  ?   Heart sounds: Normal heart sounds. No murmur heard. ?Pulmonary:  ?   Effort: Pulmonary effort is normal.  ?   Breath sounds: Normal breath sounds.  ?Abdominal:  ?   Palpations: Abdomen is soft.  ?   Tenderness: There is no abdominal tenderness. There is no guarding or rebound.  ?Musculoskeletal:     ?   General: Normal range of motion.  ?   Cervical  back: Normal range of motion.  ?Lymphadenopathy:  ?   Cervical: No cervical adenopathy.  ?Neurological:  ?   General: No focal deficit present.  ?   Mental Status: She is alert and oriented to person, place, and time.  ?   Cranial Nerves: No cranial nerve deficit.  ?Skin: ?   General: Skin is warm and dry.  ?Psychiatric:     ?   Mood and Affect: Mood normal.     ?   Behavior: Behavior normal.     ?   Thought Content: Thought content normal.     ?  Judgment: Judgment normal.  ?Vitals reviewed.  ? ? ?Results: ?Results for orders placed or performed in visit on 03/01/22 (from the past 24 hour(s))  ?POCT Urinalysis Dipstick     Status: Abnormal  ? Collection Time: 03/01/22  4:16 PM  ?Result Value Ref Range  ? Color, UA yellow   ? Clarity, UA clear   ? Glucose, UA Negative Negative  ? Bilirubin, UA neg   ? Ketones, UA neg   ? Spec Grav, UA 1.025 1.010 - 1.025  ? Blood, UA trace   ? pH, UA 5.0 5.0 - 8.0  ? Protein, UA Negative Negative  ? Urobilinogen, UA    ? Nitrite, UA neg   ? Leukocytes, UA Negative Negative  ? Appearance    ? Odor    ?POCT Wet Prep with KOH     Status: Normal  ? Collection Time: 03/01/22  4:17 PM  ?Result Value Ref Range  ? Trichomonas, UA Negative   ? Clue Cells Wet Prep HPF POC neg   ? Epithelial Wet Prep HPF POC    ? Yeast Wet Prep HPF POC neg   ? Bacteria Wet Prep HPF POC    ? RBC Wet Prep HPF POC    ? WBC Wet Prep HPF POC    ? KOH Prep POC Negative Negative  ? ? ?Assessment/Plan: ?Encounter for annual routine gynecological examination ? ?Cervical cancer screening - Plan: Cytology - PAP ? ?Screening for STD (sexually transmitted disease) - Plan: Cytology - PAP ? ?Encounter for surveillance of contraceptive pills - Plan: Norgestimate-Ethinyl Estradiol Triphasic 0.18/0.215/0.25 MG-35 MCG tablet; OCP RF eRxd ? ?Vaginal irritation - Plan: fluconazole (DIFLUCAN) 150 MG tablet, POCT Wet Prep with KOH; tender on exam/with sex; neg wet prep. Rx diflucan to treat empirically, try OTC hydrocortisone crm.  Rule out STDs. F/u prn.  ? ?Urinary frequency - Plan: POCT Urinalysis Dipstick, Urine Culture; essent neg UA except trace RBCs. Check C&S, if neg, decrease caffeine/sodas. F/u prn.  ? ?Meds ordered this encou

## 2022-03-01 NOTE — Patient Instructions (Signed)
I value your feedback and you entrusting us with your care. If you get a [facility] patient survey, I would appreciate you taking the time to let us know about your experience today. Thank you! ? ? ?

## 2022-03-04 LAB — URINE CULTURE

## 2022-03-10 ENCOUNTER — Telehealth: Payer: Self-pay

## 2022-03-10 LAB — CYTOLOGY - PAP
Adequacy: ABSENT
Chlamydia: NEGATIVE
Comment: NEGATIVE
Comment: NEGATIVE
Comment: NORMAL
Diagnosis: UNDETERMINED — AB
High risk HPV: POSITIVE — AB
Neisseria Gonorrhea: NEGATIVE

## 2022-03-10 NOTE — Telephone Encounter (Signed)
Pt left msg on triage saying she had a missed call from our office. Did you call her? Didn't see any notes in her chart and I see pap resulted this morning. ?

## 2022-03-11 NOTE — Telephone Encounter (Signed)
I tried but didn't get VM. Just sent her results msg and office to call to schedule colpo for her.

## 2022-03-11 NOTE — Progress Notes (Signed)
Pls call pt to schedule colpo. I just sent her msg through MyChart but haven't talked to her yet.

## 2022-03-15 ENCOUNTER — Telehealth: Payer: Self-pay

## 2022-03-15 NOTE — Telephone Encounter (Signed)
-----   Message from Rica Records, PA-C sent at 03/11/2022 12:03 PM EDT ----- Pls call pt to schedule colpo. I just sent her msg through MyChart but haven't talked to her yet.

## 2022-03-15 NOTE — Telephone Encounter (Signed)
Called and left voicemail for patient to call back to be scheduled. 

## 2022-03-16 NOTE — Telephone Encounter (Signed)
Patient is scheduled for 04/05/22 with [facility] 

## 2022-04-05 ENCOUNTER — Ambulatory Visit (INDEPENDENT_AMBULATORY_CARE_PROVIDER_SITE_OTHER): Payer: Managed Care, Other (non HMO) | Admitting: Obstetrics

## 2022-04-05 ENCOUNTER — Encounter: Payer: Self-pay | Admitting: Obstetrics

## 2022-04-05 ENCOUNTER — Ambulatory Visit: Payer: Managed Care, Other (non HMO)

## 2022-04-05 ENCOUNTER — Other Ambulatory Visit (HOSPITAL_COMMUNITY)
Admission: RE | Admit: 2022-04-05 | Discharge: 2022-04-05 | Disposition: A | Discharge status: Home / Self Care | Payer: Managed Care, Other (non HMO) | Source: Ambulatory Visit | Attending: Obstetrics | Admitting: Obstetrics

## 2022-04-05 ENCOUNTER — Ambulatory Visit: Payer: Managed Care, Other (non HMO) | Admitting: Obstetrics

## 2022-04-05 VITALS — BP 110/70 | Ht 63.0 in | Wt 134.0 lb

## 2022-04-05 DIAGNOSIS — R87810 Cervical high risk human papillomavirus (HPV) DNA test positive: Secondary | ICD-10-CM

## 2022-04-05 DIAGNOSIS — R87610 Atypical squamous cells of undetermined significance on cytologic smear of cervix (ASC-US): Secondary | ICD-10-CM | POA: Diagnosis present

## 2022-04-05 DIAGNOSIS — N871 Moderate cervical dysplasia: Secondary | ICD-10-CM

## 2022-04-05 DIAGNOSIS — Z7689 Persons encountering health services in other specified circumstances: Secondary | ICD-10-CM

## 2022-04-05 DIAGNOSIS — Z3202 Encounter for pregnancy test, result negative: Secondary | ICD-10-CM | POA: Diagnosis not present

## 2022-04-05 LAB — POCT URINE PREGNANCY: Preg Test, Ur: NEGATIVE

## 2022-04-05 NOTE — Patient Instructions (Signed)
Nothing in vagina for 1 week, including intercourse.  Brown/black discharge or light bleeding may occur for a few days.  We will call you with pathology results in a few days. Call for severe pain, bleeding greater than one pad an hour, temperature above 100.4, or foul smelling discharge.  Colposcopy: What to Expect at Home Your Recovery You may feel some soreness in your vagina for a day or two if you had a biopsy. Some vaginal bleeding or discharge is normal for up to a week after a biopsy. The discharge may be dark-colored if a solution was put on your cervix. You can use a sanitary pad for the bleeding. It may take a week or two for you to get the test results. This care sheet gives you a general idea about how long it will take for you to recover. But each person recovers at a different pace. Follow the steps below to feel better as quickly as possible. How can you care for yourself at home? Activity You can return to work and most daily activities right after the test. Exercise Do not exercise for 1 day after the test. Medicines  Your doctor will tell you if and when you can restart your medicines. He or she will also give you instructions about taking any new medicines. If you take aspirin or some other blood thinner, ask your doctor if and when to start taking it again. Make sure that you understand exactly what your doctor wants you to do. Take an over-the-counter pain medicine, such as acetaminophen (Tylenol), ibuprofen (Advil, Motrin), or naproxen (Aleve). Be safe with medicines. Read and follow all instructions on the label. Do not take two or more pain medicines at the same time unless the doctor told you to. Many pain medicines have acetaminophen, which is Tylenol. Too much acetaminophen (Tylenol) can be harmful. Other instructions Use a pad if you have some bleeding. Do not douche, have sexual intercourse, or use tampons for 1 week if you had a biopsy. This will allow time for your  cervix to heal. You can take a bath or shower anytime after the test. Follow-up care is a key part of your treatment and safety. Be sure to make and go to all appointments, and call your doctor if you are having problems. It's also a good idea to know your test results and keep a list of the medicines you take. When should you call for help? Call your doctor now or seek immediate medical care if: You have severe vaginal bleeding. This means that you are soaking through your usual pads or tampons each hour for 2 or more hours. You have pain that does not get better after you take pain medicine. You have signs of infection, such as: Increased pain. Bad-smelling vaginal discharge. A fever. Watch closely for any changes in your health, and be sure to contact your doctor if: You have questions or concerns. Where can you learn more? Go to the "Search Medical Library" option found under the Resources tab in [facility] [email or web address]. If you do not have access to [facility], you can visit [email or web address] and select Health Library from the menu on the left. Enter M523 in the search box to learn more about Colposcopy: What to Expect at Home. Not on [facility]? Go to [email or web address] and click the "Sign Up Now" link to request an activation code. Current as of: June 28, 2018               Content Version:   12.5  2006-2020 [employer].  Care instructions adapted under license by your healthcare professional. This care instruction is for use with your licensed healthcare professional. If you have questions about a medical condition or this instruction, always ask your healthcare professional. [employer] disclaims any warranty or liability for your use of this information.  

## 2022-04-05 NOTE — Progress Notes (Signed)
Chief Complaint  Patient presents with   Colposcopy   Colposcopy Note  Patient Robin Mccall is an 26 y.o. year old G0P0000 Patient's last menstrual period was 03/24/2022. currently OCPs for contraception who presents for colposcopy.   Patient is sent in consult from Robin Mccall Last pap ASCUS HPV pos date 03/01/22 Prior cervical/vaginal/vulvar disease and treatment : none The patient is not a smoker.  Her menses are regular every 28-30 days, lasting 3-4 days.  Dysmenorrhea mild. She does not have intermenstrual bleeding. The patient is sexually active. Denies history of pelvic infections.   Procedure for colposcopy and biopsy has been explained to the patient and consent has been obtained.  The HPV virus has been discussed.  The patient has not received the Gardisil vaccine.  UPT negative today.   BP 110/70   Ht 5\' 3"  (1.6 m)   Wt 134 lb (60.8 kg)   LMP 03/24/2022   BMI 23.74 kg/m  Physical Exam Exam conducted with a chaperone present.  Genitourinary:    General: Normal vulva.      Procedure Note: Colposcopy  With patient in lithotomy position, the speculum was placed without difficulty. The entire cervix was visualized.  The cervix was swabbed with acetic acid solution. Green light filter used: yes  FINDINGS: Normal external genitalia without lesions(vulvar colposcopy not performed.) Normal vagina without visible lesions Transformation zone clearly visualized: yes Cervix: as per image above Cervical biopsies taken at 7:00 o'clock.   Endocervical curettage performed. Biopsy sites were made to be hemostatic with Monsel's solution. Patient tolerated procedure well.  ASSESSMENT:  ASCUS with positive high risk HPV cervical  Encounter for biopsy - Plan: POCT urine pregnancy Possible cervicitis with friability, prominent bleeding with biospies; HPV related change/ CIN 1 Satisfactory Colposcopy: Yes.    PLAN:    Specimens labeled and sent to Pathology. Pathology  results will determine plan of care.  Will call patient with results.  Patient was instructed: nothing per vagina x 1 week, including intercourse. Patientt is aware that brown/black discharge or light bleeding may occur.  Return if symptoms worsen or fail to improve, for annual/well woman.

## 2022-04-06 ENCOUNTER — Telehealth: Payer: Self-pay | Admitting: Obstetrics

## 2022-04-06 NOTE — Telephone Encounter (Signed)
Called pt to schedule Gardisil injection, if desired.  Left message for pt to call back.

## 2022-04-07 LAB — SURGICAL PATHOLOGY

## 2022-04-08 ENCOUNTER — Telehealth: Payer: Self-pay

## 2022-04-08 NOTE — Telephone Encounter (Signed)
Pt left msg on triage requesting call back to discuss test results. Called pt back, no answer, LVMTRC.

## 2022-04-08 NOTE — Telephone Encounter (Signed)
Pt called back, advised Dr Sharlet Salina will review results then f/u with her.

## 2022-04-11 NOTE — Telephone Encounter (Signed)
I left voicemail for patient to call back to scheduled for her 1st Gardsil injection appointment.

## 2022-04-18 ENCOUNTER — Telehealth: Payer: Self-pay

## 2022-04-18 NOTE — Telephone Encounter (Signed)
-----   Message from Jamila Crawford, MD sent at 04/16/2022  8:43 AM EDT ----- Please inform patient that results show CIN 2 and LEEP is recommended. Please schedule pre op for further discussion.  

## 2022-04-18 NOTE — Progress Notes (Signed)
Will call pt back after 4 pm per her message.

## 2022-04-18 NOTE — Telephone Encounter (Signed)
Pt aware of results. Can you call her and help get this scheduled?

## 2022-04-18 NOTE — Telephone Encounter (Signed)
-----   Message from Horald Pollen, MD sent at 04/16/2022  8:43 AM EDT ----- Please inform patient that results show CIN 2 and LEEP is recommended. Please schedule pre op for further discussion.

## 2022-04-18 NOTE — Telephone Encounter (Signed)
Pt called back. Available today from 11-12 and/or after 4 pm.

## 2022-04-18 NOTE — Telephone Encounter (Signed)
Please let me know when pt callsback ?

## 2022-04-19 NOTE — Telephone Encounter (Signed)
Pt has been notified and sent to [facility] for scheduling of LEEP

## 2022-04-21 NOTE — Telephone Encounter (Signed)
I left voicemail for patient to call back to be scheduled for my chart video appt with Dr. Logan Bores. (Opening on Friday 04/22/22 )

## 2022-04-21 NOTE — Telephone Encounter (Signed)
Patient is scheduled for 05/13/22 with Dr. Evans 

## 2022-05-13 ENCOUNTER — Telehealth (INDEPENDENT_AMBULATORY_CARE_PROVIDER_SITE_OTHER): Payer: Managed Care, Other (non HMO) | Admitting: Obstetrics and Gynecology

## 2022-05-13 ENCOUNTER — Encounter: Payer: Self-pay | Admitting: Obstetrics and Gynecology

## 2022-05-13 VITALS — Ht 63.0 in | Wt 130.0 lb

## 2022-05-13 DIAGNOSIS — N871 Moderate cervical dysplasia: Secondary | ICD-10-CM | POA: Diagnosis not present

## 2022-05-13 NOTE — Progress Notes (Signed)
Virtual Visit via Video Note  I connected with Robin Mccall on 05/13/22 at 10:45 AM EDT by video and verified that I was speaking with the correct person using two identifiers.    Robin Mccall is a 26 y.o. G0P0000 who LMP was No LMP recorded (lmp unknown). I discussed the limitations, risks, security and privacy concerns of performing an evaluation and management service by video and the availability of in person appointments. I also discussed with the patient that there may be a patient responsible charge related to this service. The patient expressed understanding and agreed to proceed.  Location of patient:  HOME  Patient gave explicit verbal consent for video visit:  YES  Location of provider:  [facility] office  Persons other than physician and patient involved in provider conference:  None   Subjective:   History of Present Illness:    She has a history of ASCUS Pap with positive HPV.  She underwent colposcopically directed biopsies and both showed CIN-2. She does not smoke cigarettes.  Hx: The following portions of the patient's history were reviewed and updated as appropriate:             She  has a past medical history of Abdominal pain, Allergic rhinitis, Asthma, Dermatitis, Dysmenorrhea, Glucose found in urine on examination, HA (headache), HLD (hyperlipidemia), Hydronephrosis, Onychodystrophy (09/24/2015), Reflux, Syncope, Vitamin D deficiency, and Wrist pain. She does not have any pertinent problems on file. She  has a past surgical history that includes laporoscopic abdomen; Diagnostic laparoscopy; and Ganglion cyst excision (Left, 12/14/2015). Her family history includes Bone cancer in her maternal grandfather; Stomach cancer in her maternal great-grandfather; Stroke in her maternal aunt. She  reports that she has never smoked. She has never used smokeless tobacco. She reports that she does not currently use alcohol. She reports that she does not use drugs. She has a current  medication list which includes the following prescription(s): alprazolam, aripiprazole, buspirone, escitalopram, norgestimate-ethinyl estradiol triphasic, and topiramate. She has No Known Allergies.       Review of Systems:  Review of Systems  Constitutional: Denied constitutional symptoms, night sweats, recent illness, fatigue, fever, insomnia and weight loss.  Eyes: Denied eye symptoms, eye pain, photophobia, vision change and visual disturbance.  Ears/Nose/Throat/Neck: Denied ear, nose, throat or neck symptoms, hearing loss, nasal discharge, sinus congestion and sore throat.  Cardiovascular: Denied cardiovascular symptoms, arrhythmia, chest pain/pressure, edema, exercise intolerance, orthopnea and palpitations.  Respiratory: Denied pulmonary symptoms, asthma, pleuritic pain, productive sputum, cough, dyspnea and wheezing.  Gastrointestinal: Denied, gastro-esophageal reflux, melena, nausea and vomiting.  Genitourinary: Denied genitourinary symptoms including symptomatic vaginal discharge, pelvic relaxation issues, and urinary complaints.  Musculoskeletal: Denied musculoskeletal symptoms, stiffness, swelling, muscle weakness and myalgia.  Dermatologic: Denied dermatology symptoms, rash and scar.  Neurologic: Denied neurology symptoms, dizziness, headache, neck pain and syncope.  Psychiatric: Denied psychiatric symptoms, anxiety and depression.  Endocrine: Denied endocrine symptoms including hot flashes and night sweats.   Meds:   Current Outpatient Medications on File Prior to Visit  Medication Sig Dispense Refill   ALPRAZolam (XANAX) 0.5 MG tablet Take 0.5 mg by mouth at bedtime.     ARIPiprazole (ABILIFY) 5 MG tablet Take 5 mg by mouth daily.     busPIRone (BUSPAR) 5 MG tablet Take 5 mg by mouth 3 (three) times daily as needed.     escitalopram (LEXAPRO) 20 MG tablet Take 20 mg by mouth daily.     Norgestimate-Ethinyl Estradiol Triphasic 0.18/0.215/0.25 MG-35 MCG tablet  Take 1 tablet by  mouth daily. 84 tablet 3   topiramate (TOPAMAX) 25 MG tablet Take 25 mg by mouth at bedtime.     No current facility-administered medications on file prior to visit.    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  We have discussed HPV and its relationship to cervical dysplasia and cervical cancer.  We discussed the diagnosis of CIN-2.  After long discussion I have given her 2 options.  She can undergo LEEP for CIN-2 or consider expectant management for 6 months. I think expectant management for 6 months is reasonable based on her age.  CIN is unlikely to advance in 6 months and she has a reasonable chance of her immune system causing regression of CIN-2.  The fact that she is nulliparous should also be a factor to consider. She states that she will speak with her mother about all of the things we spoke about today and once she has reached a decision LEEP versus expectant management with follow-up Pap in 6 months she will inform us. Orders No orders of the defined types were placed in this encounter.   No orders of the defined types were placed in this encounter.     F/U  Return in about 6 months (around 11/13/2022). I spent 22 minutes involved in the care of this patient preparing to see the patient by obtaining and reviewing her medical history (including labs, imaging tests and prior procedures), documenting clinical information in the electronic health record (EHR), counseling and coordinating care plans, writing and sending prescriptions, ordering tests or procedures and in direct communicating with the patient and medical staff discussing pertinent items from her history and physical exam.   Elonda Husky,  M.D. 05/13/2022 11:19 AM

## 2022-05-13 NOTE — Progress Notes (Signed)
Patient present via virtual visit (in car) to discuss recent colposcopy results. She states using daily OCP's and does not recall her last menstrual cycle due to prolonged bleeding postprocedure. She states no questions or concerns at this time.

## 2022-06-07 ENCOUNTER — Telehealth: Payer: Self-pay

## 2022-06-07 NOTE — Telephone Encounter (Signed)
Pt calling; is having a period between nl periods.  [phone] Pt states she has not missed or been late taking any pills; states flow is light but continuous;  states she hasn't done this since she was 26yo.  Adv will send to [place].

## 2022-06-07 NOTE — Telephone Encounter (Signed)
Pt to take UPT. If neg, on generic OCPs and BTB can happen. Cont OCPs and f/u if happens again next cycle.

## 2022-06-07 NOTE — Telephone Encounter (Signed)
Pt aware; has taken two home UPTs - both negative.

## 2022-06-07 NOTE — Telephone Encounter (Signed)
LMTC

## 2022-06-14 ENCOUNTER — Encounter: Payer: Self-pay | Admitting: Obstetrics and Gynecology

## 2022-06-14 ENCOUNTER — Ambulatory Visit (INDEPENDENT_AMBULATORY_CARE_PROVIDER_SITE_OTHER): Payer: Managed Care, Other (non HMO) | Admitting: Obstetrics and Gynecology

## 2022-06-14 ENCOUNTER — Other Ambulatory Visit (HOSPITAL_COMMUNITY)
Admission: RE | Admit: 2022-06-14 | Discharge: 2022-06-14 | Disposition: A | Discharge status: Home / Self Care | Payer: Managed Care, Other (non HMO) | Source: Ambulatory Visit | Attending: Obstetrics and Gynecology | Admitting: Obstetrics and Gynecology

## 2022-06-14 VITALS — BP 100/70 | Ht 63.0 in | Wt 137.0 lb

## 2022-06-14 DIAGNOSIS — N921 Excessive and frequent menstruation with irregular cycle: Secondary | ICD-10-CM | POA: Diagnosis not present

## 2022-06-14 DIAGNOSIS — B9689 Other specified bacterial agents as the cause of diseases classified elsewhere: Secondary | ICD-10-CM

## 2022-06-14 DIAGNOSIS — Z3202 Encounter for pregnancy test, result negative: Secondary | ICD-10-CM

## 2022-06-14 DIAGNOSIS — N760 Acute vaginitis: Secondary | ICD-10-CM

## 2022-06-14 DIAGNOSIS — Z113 Encounter for screening for infections with a predominantly sexual mode of transmission: Secondary | ICD-10-CM | POA: Insufficient documentation

## 2022-06-14 DIAGNOSIS — N941 Unspecified dyspareunia: Secondary | ICD-10-CM

## 2022-06-14 DIAGNOSIS — B3731 Acute candidiasis of vulva and vagina: Secondary | ICD-10-CM | POA: Insufficient documentation

## 2022-06-14 LAB — POCT WET PREP WITH KOH
Clue Cells Wet Prep HPF POC: POSITIVE
KOH Prep POC: POSITIVE — AB
Trichomonas, UA: NEGATIVE
Yeast Wet Prep HPF POC: NEGATIVE

## 2022-06-14 LAB — POCT URINE PREGNANCY: Preg Test, Ur: NEGATIVE

## 2022-06-14 MED ORDER — METRONIDAZOLE 500 MG PO TABS: ORAL_TABLET | ORAL | 0 refills | Status: DC

## 2022-06-14 NOTE — Progress Notes (Signed)
Evelene Croon, MD   Chief Complaint  Patient presents with   Vaginal Bleeding    Had another full cycle one week after reg cycle, 2 neg home UPTs, pain during intercourse   Vaginal Discharge    Sour odor, no itching    HPI:      [name] is a 26 y.o. G0P0000 whose LMP was Patient's last menstrual period was 05/22/2022 (exact date)., presents today for increased vaginal d/c with odor, no irritation for 4 days. Hx of BV in past and feels similar. Started with mid cycle spotting, no meds to treat.  Pt also with BTB on OCPs this cycle, brown and pink; never happened before. On generic pills and brand changed 1-2 months ago. Menses usually monthly, lasting 3-4 days, no BTB. Had 2 neg UPTs at home.  Pt developed pain with sex externally when BTB started. No vag dryness, no urin sx. No sx if not sexually active. No LBP, pelvic pain, fevers. 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    Past Surgical History:  Procedure Laterality Date   DIAGNOSTIC LAPAROSCOPY     GANGLION CYST EXCISION Left 12/14/2015   Procedure: REMOVAL GANGLION OF WRIST;  Surgeon: Deeann Saint, MD;  Location: [facility] ORS;  Service: Orthopedics;  Laterality: Left;   laporoscopic abdomen      Family History  Problem Relation Age of Onset   Stroke Maternal Aunt    Bone cancer Maternal Grandfather    Stomach cancer Maternal Great-grandfather    Kidney disease Neg Hx    Bladder Cancer Neg Hx     Social History   Socioeconomic History   Marital status: Single    Spouse name: Not on file   Number of children: Not on file   Years of education: Not on file   Highest education level: Not on file  Occupational History   Not on file  Tobacco Use   Smoking status: Never   Smokeless tobacco: Never  Vaping Use    Vaping Use: Never used  Substance and Sexual Activity   Alcohol use: Not Currently   Drug use: No   Sexual activity: Yes    Birth control/protection: Pill  Other Topics Concern   Not on file  Social History Narrative   Not on file   Social Determinants of Health   Financial Resource Strain: Not on file  Food Insecurity: Not on file  Transportation Needs: Not on file  Physical Activity: Not on file  Stress: Not on file  Social Connections: Not on file  Intimate Partner Violence: Not on file    Outpatient Medications Prior to Visit  Medication Sig Dispense Refill   ALPRAZolam (XANAX) 0.5 MG tablet Take 0.5 mg by mouth at bedtime.     ARIPiprazole (ABILIFY) 5 MG tablet Take 5 mg by mouth daily.     busPIRone (BUSPAR) 5 MG tablet Take 5 mg by mouth 3 (three) times daily as needed.     escitalopram (LEXAPRO) 20 MG tablet Take 20 mg by mouth daily.     topiramate (TOPAMAX) 25 MG tablet Take 25 mg by mouth at bedtime.     traZODone (DESYREL) 50 MG tablet Take 50 mg by mouth at bedtime.     TRI-LO-MILI 0.18/0.215/0.25 MG-25 MCG tab Take 1 tablet by  mouth daily.     venlafaxine XR (EFFEXOR-XR) 37.5 MG 24 hr capsule Take 37.5 mg by mouth daily.     Norgestimate-Ethinyl Estradiol Triphasic 0.18/0.215/0.25 MG-35 MCG tablet Take 1 tablet by mouth daily. 84 tablet 3   No facility-administered medications prior to visit.     ROS:  Review of Systems  Constitutional:  Negative for fever.  Gastrointestinal:  Negative for blood in stool, constipation, diarrhea, nausea and vomiting.  Genitourinary:  Positive for dyspareunia and vaginal discharge. Negative for dysuria, flank pain, frequency, hematuria, urgency, vaginal bleeding and vaginal pain.  Musculoskeletal:  Negative for back pain.  Skin:  Negative for rash.    OBJECTIVE:   Vitals:  BP 100/70   Ht 5\' 3"  (1.6 m)   Wt 137 lb (62.1 kg)   LMP 05/22/2022 (Exact Date)   BMI 24.27 kg/m   Physical Exam Vitals reviewed.   Constitutional:      Appearance: She is well-developed.  Pulmonary:     Effort: Pulmonary effort is normal.  Genitourinary:    General: Normal vulva.     Pubic Area: No rash.      Labia:        Right: Tenderness present. No rash or lesion.        Left: No rash, tenderness or lesion.      Vagina: Normal. No vaginal discharge, erythema or tenderness.     Cervix: Normal.     Uterus: Normal. Not enlarged and not tender.      Adnexa: Right adnexa normal and left adnexa normal.       Right: No mass or tenderness.         Left: No mass or tenderness.      Musculoskeletal:        General: Normal range of motion.     Cervical back: Normal range of motion.  Skin:    General: Skin is warm and dry.  Neurological:     General: No focal deficit present.     Mental Status: She is alert and oriented to person, place, and time.  Psychiatric:        Mood and Affect: Mood normal.        Behavior: Behavior normal.        Thought Content: Thought content normal.        Judgment: Judgment normal.     Results: Results for orders placed or performed in visit on 06/14/22 (from the past 24 hour(s))  POCT Wet Prep with KOH     Status: Abnormal   Collection Time: 06/14/22  1:13 PM  Result Value Ref Range   Trichomonas, UA Negative    Clue Cells Wet Prep HPF POC pos    Epithelial Wet Prep HPF POC     Yeast Wet Prep HPF POC neg    Bacteria Wet Prep HPF POC     RBC Wet Prep HPF POC     WBC Wet Prep HPF POC     KOH Prep POC Positive (A) Negative  POCT urine pregnancy     Status: Normal   Collection Time: 06/14/22  1:15 PM  Result Value Ref Range   Preg Test, Ur Negative Negative     Assessment/Plan: BV (bacterial vaginosis) - Plan: metroNIDAZOLE (FLAGYL) 500 MG tablet, POCT Wet Prep with KOH; pos sx and wet prep. Rx flagyl, no EtOH. F/u prn.   Dyspareunia in female--tender externally, neg exam. Treat for BV, rule out STD/yeast on culture. F/u prn.   Screening for  STD (sexually  transmitted disease) - Plan: Cervicovaginal ancillary only  Breakthrough bleeding on OCPs--neg UPT. Reassurance. Most likely due to generic pills with recent changes; not related to other sx. F/u if sx persist.    Meds ordered this encounter  Medications   metroNIDAZOLE (FLAGYL) 500 MG tablet    Sig: Take 1 tab BID for 7 days; NO alcohol use for 10 days after prescription start    Dispense:  14 tablet    Refill:  0    Order Specific Question:   Supervising Provider    Answer:   Waymon Budge      Return if symptoms worsen or fail to improve.  Corby Vandenberghe B. Aisha Greenberger, PA-C 06/14/2022 1:16 PM

## 2022-06-14 NOTE — Patient Instructions (Signed)
I value your feedback and you entrusting us with your care. If you get a [facility] patient survey, I would appreciate you taking the time to let us know about your experience today. Thank you! ? ? ?

## 2022-06-15 ENCOUNTER — Other Ambulatory Visit: Payer: Self-pay

## 2022-06-15 DIAGNOSIS — B3731 Acute candidiasis of vulva and vagina: Secondary | ICD-10-CM

## 2022-06-15 LAB — CERVICOVAGINAL ANCILLARY ONLY
Candida Glabrata: NEGATIVE
Candida Vaginitis: POSITIVE — AB
Chlamydia: NEGATIVE
Comment: NEGATIVE
Comment: NEGATIVE
Comment: NEGATIVE
Comment: NEGATIVE
Comment: NORMAL
Neisseria Gonorrhea: NEGATIVE
Trichomonas: NEGATIVE

## 2022-06-15 MED ORDER — FLUCONAZOLE 150 MG PO TABS: 150.0000 mg | ORAL_TABLET | Freq: Once | ORAL | 0 refills | Status: AC

## 2022-06-15 NOTE — Progress Notes (Signed)
Pt called triage stating her labs was positive for yeast and can we send in a rx. Diflucan sent to pt pharmacy on file.

## 2022-07-01 ENCOUNTER — Encounter: Payer: Self-pay | Admitting: Emergency Medicine

## 2022-07-01 ENCOUNTER — Other Ambulatory Visit: Payer: Self-pay

## 2022-07-01 DIAGNOSIS — R0981 Nasal congestion: Secondary | ICD-10-CM | POA: Diagnosis not present

## 2022-07-01 DIAGNOSIS — H73893 Other specified disorders of tympanic membrane, bilateral: Secondary | ICD-10-CM | POA: Diagnosis not present

## 2022-07-01 DIAGNOSIS — R42 Dizziness and giddiness: Secondary | ICD-10-CM | POA: Diagnosis present

## 2022-07-01 DIAGNOSIS — R202 Paresthesia of skin: Secondary | ICD-10-CM | POA: Diagnosis not present

## 2022-07-01 DIAGNOSIS — D72829 Elevated white blood cell count, unspecified: Secondary | ICD-10-CM | POA: Diagnosis not present

## 2022-07-01 DIAGNOSIS — N390 Urinary tract infection, site not specified: Secondary | ICD-10-CM | POA: Insufficient documentation

## 2022-07-01 DIAGNOSIS — R519 Headache, unspecified: Secondary | ICD-10-CM | POA: Insufficient documentation

## 2022-07-01 DIAGNOSIS — J45909 Unspecified asthma, uncomplicated: Secondary | ICD-10-CM | POA: Insufficient documentation

## 2022-07-01 LAB — CBC
HCT: 42.2 % (ref 36.0–46.0)
Hemoglobin: 13.8 g/dL (ref 12.0–15.0)
MCH: 28.1 pg (ref 26.0–34.0)
MCHC: 32.7 g/dL (ref 30.0–36.0)
MCV: 85.9 fL (ref 80.0–100.0)
Platelets: 294 10*3/uL (ref 150–400)
RBC: 4.91 MIL/uL (ref 3.87–5.11)
RDW: 12.7 % (ref 11.5–15.5)
WBC: 12.3 10*3/uL — ABNORMAL HIGH (ref 4.0–10.5)
nRBC: 0 % (ref 0.0–0.2)

## 2022-07-01 LAB — BASIC METABOLIC PANEL
Anion gap: 4 — ABNORMAL LOW (ref 5–15)
BUN: 19 mg/dL (ref 6–20)
CO2: 26 mmol/L (ref 22–32)
Calcium: 8.6 mg/dL — ABNORMAL LOW (ref 8.9–10.3)
Chloride: 107 mmol/L (ref 98–111)
Creatinine, Ser: 0.56 mg/dL (ref 0.44–1.00)
GFR, Estimated: >60 mL/min (ref >60)
Glucose, Bld: 109 mg/dL — ABNORMAL HIGH (ref 70–99)
Potassium: 3.8 mmol/L (ref 3.5–5.1)
Sodium: 137 mmol/L (ref 135–145)

## 2022-07-01 LAB — URINALYSIS, ROUTINE W REFLEX MICROSCOPIC
Bilirubin Urine: NEGATIVE
Glucose, UA: NEGATIVE mg/dL
Ketones, ur: NEGATIVE mg/dL
Nitrite: NEGATIVE
Protein, ur: NEGATIVE mg/dL
Specific Gravity, Urine: 1.023 (ref 1.005–1.030)
pH: 6 (ref 5.0–8.0)

## 2022-07-01 LAB — CBG MONITORING, ED: Glucose-Capillary: 112 mg/dL — ABNORMAL HIGH (ref 70–99)

## 2022-07-01 NOTE — ED Triage Notes (Signed)
Pt reports she has been dealing with dizziness for about 2 weeks, reports today dizziness has been more often. Pt reports she is eating adequately, hydrating. Pt denies any headache, no changes in vision. Pt reports tingling sensation to arms for the past [redacted] weeks along with headaches. Pt talks in complete sentences no respiratory distress noted

## 2022-07-02 ENCOUNTER — Emergency Department: Payer: Managed Care, Other (non HMO)

## 2022-07-02 ENCOUNTER — Emergency Department
Admission: EM | Admit: 2022-07-02 | Discharge: 2022-07-02 | Disposition: A | Discharge status: Home / Self Care | Payer: Managed Care, Other (non HMO) | Attending: Emergency Medicine | Admitting: Emergency Medicine

## 2022-07-02 DIAGNOSIS — R42 Dizziness and giddiness: Secondary | ICD-10-CM

## 2022-07-02 DIAGNOSIS — N390 Urinary tract infection, site not specified: Secondary | ICD-10-CM

## 2022-07-02 LAB — PREGNANCY, URINE: Preg Test, Ur: NEGATIVE

## 2022-07-02 LAB — TROPONIN I (HIGH SENSITIVITY): Troponin I (High Sensitivity): 3 ng/L (ref <18)

## 2022-07-02 MED ORDER — FOSFOMYCIN TROMETHAMINE 3 G PO PACK
3.0000 g | PACK | Freq: Once | ORAL | Status: AC
  Administered 2022-07-02: 3 g via ORAL
  Filled 2022-07-02: qty 3

## 2022-07-02 NOTE — ED Notes (Signed)
Pt brought to hallway 18 at this time, this RN now assuming care.

## 2022-07-02 NOTE — ED Notes (Signed)
ED Provider at bedside. 

## 2022-07-02 NOTE — ED Notes (Signed)
Signing pad did not work, pt verbalized understanding of dc instructions.  

## 2022-07-02 NOTE — ED Provider Notes (Signed)
[facility] Provider Note    Event Date/Time   First MD Initiated Contact with Patient 07/02/22 [phone]     (approximate)   History   Dizziness   HPI  NAYVEE GINDER is a 26 y.o. female who presents to the ED from home with a 2-week history of dizziness.  Describes sporadic episodes of feeling lightheaded plus room spinning for the past 2 weeks.  No pattern.  Worse today.  Describes increased stress from a new job.  Describes drinking more sodas than water.  Sometimes the dizziness is associated with a tingling sensation to her arms or legs, again without pattern.  Occasional headaches but patient states she has frequent headaches.  Some sinus congestion and pressure.  Denies vision changes, neck pain, cough, chest pain, shortness of breath, abdominal pain, nausea, vomiting or dysuria.  Denies slurred speech, facial droop, altered mentation, extremity weakness.     Past Medical History   Past Medical History:  Diagnosis Date  . Abdominal pain   . Allergic rhinitis   . Asthma   . Dermatitis   . Dysmenorrhea   . Glucose found in urine on examination    seeing nephrologist on 12-07-15-pt unsure of what the MD's name is   . HA (headache)    migraines-related to stress  . HLD (hyperlipidemia)   . Hydronephrosis   . 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 Reflux   . Syncope   . Vitamin D deficiency   . Wrist pain      Active Problem List   Patient Active Problem List   Diagnosis Date Noted  . Bacterial vaginosis 04/14/2020  . Microscopic hematuria 02/12/2016  . 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 Dyspareunia in female 02/12/2016  . 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 DIAGNOSTIC LAPAROSCOPY    . GANGLION CYST EXCISION Left 12/14/2015   Procedure: REMOVAL GANGLION OF WRIST;  Surgeon: Earnestine Leys, MD;  Location: [facility] ORS;  Service: Orthopedics;  Laterality: Left;  . laporoscopic abdomen        Home Medications   Prior to Admission medications   Medication Sig Start Date End Date Taking? Authorizing Provider  ALPRAZolam Duanne Moron) 0.5 MG tablet Take 0.5 mg by mouth at bedtime. 02/07/22   [provider]  ARIPiprazole (ABILIFY) 5 MG tablet Take 5 mg by mouth daily. 02/06/22   [provider]  busPIRone (BUSPAR) 5 MG tablet Take 5 mg by mouth 3 (three) times daily as needed. 01/30/22   [provider]  escitalopram (LEXAPRO) 20 MG tablet Take 20 mg by mouth daily. 02/20/22   [provider]  metroNIDAZOLE (FLAGYL) 500 MG tablet Take 1 tab BID for 7 days; NO alcohol use for 10 days after prescription start [id number]   Copland, Elmo Putt B, PA-C  topiramate (TOPAMAX) 25 MG tablet Take 25 mg by mouth at bedtime. 02/06/22   [provider]  traZODone (DESYREL) 50 MG tablet Take 50 mg by mouth at bedtime. 05/20/22   [provider]  TRI-LO-MILI 0.18/0.215/0.25 MG-25 MCG tab Take 1 tablet by mouth daily. 05/24/22   [provider]  venlafaxine XR (EFFEXOR-XR) 37.5 MG 24 hr capsule Take 37.5 mg by mouth daily. 05/20/22   [provider]     Allergies  Patient has no known allergies.   Family History   Family History  Problem Relation Age of Onset  . Stroke Maternal Aunt   . Bone cancer  Maternal Grandfather   . Stomach cancer Maternal Great-grandfather   . Kidney disease Neg Hx   . Bladder Cancer Neg Hx   No family history of MS or POTS   Physical Exam  Triage Vital Signs: ED Triage Vitals  Enc Vitals Group     BP 07/01/22 2105 107/68     Pulse Rate 07/01/22 2105 96     Resp 07/01/22 2105 16     Temp 07/01/22 2105 98.5 F (36.9 C)     Temp Source 07/01/22 2105 Oral     SpO2 07/01/22 2105 99 %     Weight 07/01/22 2107 137 lb (62.1 kg)     Height 07/01/22 2107 5\' 3"  (1.6 m)     Head Circumference --      Peak Flow --      Pain Score 07/01/22 2107 0     Pain Loc --      Pain Edu? --      Excl. in GC? --      Updated Vital Signs: BP 93/64   Pulse (!) 101   Temp 98.5 F (36.9 C) (Oral)   Resp 16   Ht 5\' 3"  (1.6 m)   Wt 62.1 kg   LMP 06/17/2022 (Approximate)   SpO2 99%   BMI 24.27 kg/m    General: Awake, no distress.  CV:  RRR.  Good peripheral perfusion.  Resp:  Normal effort.  CTA B. Abd:  Nontender.  No distention.  Other:  Alert and oriented x3.  CN II toXII mostly intact.  5/5 motor strength and sensation all extremities. MAEx4.  Mild fluid behind bilateral TM; otherwise unremarkable.  No sinus tenderness to palpation.   ED Results / Procedures / Treatments  Labs (all labs ordered are listed, but only abnormal results are displayed) Labs Reviewed  BASIC METABOLIC PANEL - Abnormal; Notable for the following components:      Result Value   Glucose, Bld 109 (*)    Calcium 8.6 (*)    Anion gap 4 (*)    All other components within normal limits  CBC - Abnormal; Notable for the following components:   WBC 12.3 (*)    All other components within normal limits  URINALYSIS, ROUTINE W REFLEX MICROSCOPIC - Abnormal; Notable for the following components:   Color, Urine YELLOW (*)    APPearance HAZY (*)    Hgb urine dipstick SMALL (*)    Leukocytes,Ua TRACE (*)    Bacteria, UA RARE (*)    All other components within normal limits  CBG MONITORING, ED - Abnormal; Notable for the following components:   Glucose-Capillary 112 (*)    All other components within normal limits  PREGNANCY, URINE  POC URINE PREG, ED  TROPONIN I (HIGH SENSITIVITY)     EKG  ED ECG REPORT I, Ebrahim Deremer J, the attending physician, personally viewed and interpreted this ECG.   Date: 07/02/2022  EKG Time: 2112  Rate: 83  Rhythm: normal sinus rhythm  Axis: Normal  Intervals:none  ST&T Change: Nonspecific    RADIOLOGY I have independently visualized and interpreted patient's CT head as well as noted the radiology interpretation:  CT head: No ICH  Official radiology report(s): CT Head Wo  Contrast  Result Date: 07/02/2022 CLINICAL DATA:  Persistent dizziness for 2 weeks EXAM: CT HEAD WITHOUT CONTRAST TECHNIQUE: Contiguous axial images were obtained from the base of the skull through the vertex without intravenous contrast. RADIATION DOSE REDUCTION: This exam was performed according to the  departmental dose-optimization program which includes automated exposure control, adjustment of the mA and/or kV according to patient size and/or use of iterative reconstruction technique. COMPARISON:  02/20/2022 FINDINGS: Brain: No evidence of acute infarction, hemorrhage, hydrocephalus, extra-axial collection or mass lesion/mass effect. Vascular: No hyperdense vessel or unexpected calcification. Skull: Normal. Negative for fracture or focal lesion. Sinuses/Orbits: No acute finding. Other: None. IMPRESSION: No acute intracranial abnormality noted. Electronically Signed   By: Alcide Clever M.D.   On: 07/02/2022 01:28     PROCEDURES:  Critical Care performed: No  Procedures   MEDICATIONS ORDERED IN ED: Medications  fosfomycin (MONUROL) packet 3 g (3 g Oral Given 07/02/22 0124)     IMPRESSION / MDM / ASSESSMENT AND PLAN / ED COURSE  I reviewed the triage vital signs and the nursing notes.                             26 year old female presenting with a 2-week history of dizziness.  Differential diagnosis includes but is not limited to vertigo, dehydration, ACS, infectious, metabolic, toxicological, CVA, ICH, etiologies, etc.  I have personally reviewed patient's records and see her recent GYN visit on 06/14/2022 for bacterial vaginosis placed on Flagyl.  Patient's presentation is most consistent with acute complicated illness / injury requiring diagnostic workup.  Laboratory results demonstrate mild leukocytosis WBC 4.3, normal electrolytes, trace leukocytes in urine.  Low suspicion for CVA, ACS, aortic dissection, etc.  Will obtain orthostatic vital signs, dose fosfomycin, obtain CT head and  reassess.   Clinical Course as of 07/02/22 0215  Sat Jul 02, 2022  0108 Orthostatics +; patient declines IV. [JS]  0214 CT head and troponin unremarkable.  Will refer to cardiology for outpatient follow-up.  Strict return precautions given.  Patient verbalizes understanding and agrees with plan of care. [JS]    Clinical Course User Index [JS] Irean Hong, MD     FINAL CLINICAL IMPRESSION(S) / ED DIAGNOSES   Final diagnoses:  Dizziness  Lower urinary tract infectious disease     Rx / DC Orders   ED Discharge Orders     None        Note:  This document was prepared using Dragon voice recognition software and may include unintentional dictation errors.   Irean Hong, MD 07/02/22 [phone]

## 2022-07-02 NOTE — Discharge Instructions (Addendum)
Drink plenty of fluids daily.  Return to the ER for worsening symptoms, persistent vomiting, difficulty breathing or other concerns. °

## 2022-07-04 ENCOUNTER — Telehealth: Payer: Self-pay

## 2022-07-04 DIAGNOSIS — B3731 Acute candidiasis of vulva and vagina: Secondary | ICD-10-CM

## 2022-07-04 MED ORDER — FLUCONAZOLE 150 MG PO TABS: 150.0000 mg | ORAL_TABLET | Freq: Once | ORAL | 0 refills | Status: AC

## 2022-07-04 NOTE — Telephone Encounter (Signed)
[facility] VOICEMAIL: Patient requesting rx for Diflucan. Cb [phone]

## 2022-07-04 NOTE — Telephone Encounter (Signed)
Spoke with patient. She is having vaginal irritation with discharge (yellow, no odor or itching). Advised can send 1 refill of Diflucan with recently diagnosed yeast infection. Patient aware she will need to schedule follow up visit if this reoccurs after this treatment. Advised no vaginal treatment 24 hours prior to appointment.

## 2022-07-07 ENCOUNTER — Encounter: Payer: Self-pay | Admitting: *Deleted

## 2022-07-08 ENCOUNTER — Ambulatory Visit: Payer: Managed Care, Other (non HMO) | Attending: Cardiology | Admitting: Cardiology

## 2022-07-08 ENCOUNTER — Encounter: Payer: Self-pay | Admitting: Cardiology

## 2022-07-08 VITALS — BP 101/62 | HR 72 | Ht 63.0 in | Wt 136.2 lb

## 2022-07-08 DIAGNOSIS — R42 Dizziness and giddiness: Secondary | ICD-10-CM

## 2022-07-08 NOTE — Progress Notes (Signed)
Cardiology Office Note:    Date:  07/08/2022   ID:  Robin Mccall, DOB 03-Aug-1996, MRN [id number]  PCP:  Evelene Croon, MD   [facility] Providers Cardiologist:  None     Referring MD: Evelene Croon, MD   Chief Complaint  Patient presents with   Other    Dizziness. Meds reviewed verbally with pt.    History of Present Illness:    Robin Mccall is a 26 y.o. female with a hx of anxiety, depression who presents due to dizziness.  States having symptoms of dizziness starting 1 month ago going for about 2 weeks.  She presented to the ED due to this, work-up was unrevealing, advised to follow-up with cardiology.  Denies any history of heart disease, denies palpitations, chest pain, shortness of breath.  Has not had any dizzy symptoms over the past 2 weeks or so.  Symptoms of dizziness when not related with exertion or position.  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     Allergies:   Patient has no known allergies.   Social History   Socioeconomic History   Marital status: Single    Spouse name: Not on file   Number of children: Not on file   Years of education: Not on  file   Highest education level: Not on file  Occupational History   Not on file  Tobacco Use   Smoking status: Never   Smokeless tobacco: Never  Vaping Use   Vaping Use: Never used  Substance and Sexual Activity   Alcohol use: Not Currently   Drug use: No   Sexual activity: Yes    Birth control/protection: Pill  Other Topics Concern   Not on file  Social History Narrative   Not on file   Social Determinants of Health   Financial Resource Strain: Not on file  Food Insecurity: Not on file  Transportation Needs: Not on file  Physical Activity: Not on file  Stress: Not on file  Social Connections: Not on file     Family History: The patient's family history includes Bone cancer in her maternal grandfather; Stomach cancer in her maternal great-grandfather; Stroke in her maternal aunt. There is no history of Kidney disease or Bladder Cancer.  ROS:   Please see the history of present illness.     All other systems reviewed and are negative.  EKGs/Labs/Other Studies Reviewed:    The following studies were reviewed today:   EKG:  EKG is  ordered today.  The ekg ordered today demonstrates normal sinus rhythm, normal  ECG  Recent Labs: 07/01/2022: BUN 19; Creatinine, Ser 0.56; Hemoglobin 13.8; Platelets 294; Potassium 3.8; Sodium 137  Recent Lipid Panel No results found for: "CHOL", "TRIG", "HDL", "CHOLHDL", "VLDL", "LDLCALC", "LDLDIRECT"   Risk Assessment/Calculations:             Physical Exam:    VS:  BP 101/62 (BP Location: Right Arm, Patient Position: Sitting, Cuff Size: Normal)   Pulse 72   Ht 5\' 3"  (1.6 m)   Wt 136 lb 4 oz (61.8 kg)   LMP 06/17/2022 (Approximate)   SpO2 96%   BMI 24.14 kg/m     Wt Readings from Last 3 Encounters:  07/08/22 136 lb 4 oz (61.8 kg)  07/01/22 137 lb (62.1 kg)  06/14/22 137 lb (62.1 kg)     GEN:  Well nourished, well developed in no acute distress HEENT: Normal NECK: No JVD; No carotid bruits CARDIAC: RRR, no murmurs,  rubs, gallops RESPIRATORY:  Clear to auscultation without rales, wheezing or rhonchi  ABDOMEN: Soft, non-tender, non-distended MUSCULOSKELETAL:  No edema; No deformity  SKIN: Warm and dry NEUROLOGIC:  Alert and oriented x 3 PSYCHIATRIC:  Normal affect   ASSESSMENT:    1. Dizziness    PLAN:    In order of problems listed above:  Dizziness, orthostatic vitals with no evidence for orthostasis, patient's findings are not consistent with POTS.  She is low cardiac risk, denies any cardiac symptoms of palpitations, chest pain or shortness of breath.  Unsure if vertigo was cause for symptoms, advised to follow-up with PCP regarding possible vertigo.  Good news is patient is asymptomatic, has been for several weeks.  Blood pressures controlled.  Adequate hydration advised.  No indication for additional cardiac testing at this time.      Follow-up as needed   Medication Adjustments/Labs and Tests Ordered: Current medicines are reviewed at length with the patient today.  Concerns regarding medicines are outlined above.  Orders Placed This Encounter  Procedures   EKG 12-Lead   No orders of the defined types were placed in this encounter.   Patient Instructions  Medication Instructions:   Your physician recommends that you continue on your current medications as directed. Please refer to the Current Medication list given to you today.  *If you need a refill on your cardiac medications before your next appointment, please call your pharmacy*    Follow-Up: At [facility], you and your health needs are our priority.  As part of our continuing mission to provide you with exceptional heart care, we have created designated Provider Care Teams.  These Care Teams include your primary Cardiologist (physician) and Advanced Practice Providers (APPs -  Physician Assistants and Nurse Practitioners) who all work together to provide you with the care you need, when you need it.  We  recommend signing up for the patient portal called "MyChart".  Sign up information is provided on this After Visit Summary.  MyChart is used to connect with patients for Virtual Visits (Telemedicine).  Patients are able to view lab/test results, encounter notes, upcoming appointments, etc.  Non-urgent messages can be sent to your provider as well.   To learn more about what you can do with MyChart, go to [facility].    Your next appointment:   Follow up as needed   The format for your next appointment:   In Person  Provider:   [email or web address], MD    Other Instructions   Important Information About Sugar  Signed, Debbe Odea, MD  07/08/2022 5:10 PM    [facility]

## 2022-07-08 NOTE — Patient Instructions (Signed)
Medication Instructions:   Your physician recommends that you continue on your current medications as directed. Please refer to the Current Medication list given to you today.  *If you need a refill on your cardiac medications before your next appointment, please call your pharmacy*    Follow-Up: At [facility], you and your health needs are our priority.  As part of our continuing mission to provide you with exceptional heart care, we have created designated Provider Care Teams.  These Care Teams include your primary Cardiologist (physician) and Advanced Practice Providers (APPs -  Physician Assistants and Nurse Practitioners) who all work together to provide you with the care you need, when you need it.  We recommend signing up for the patient portal called "MyChart".  Sign up information is provided on this After Visit Summary.  MyChart is used to connect with patients for Virtual Visits (Telemedicine).  Patients are able to view lab/test results, encounter notes, upcoming appointments, etc.  Non-urgent messages can be sent to your provider as well.   To learn more about what you can do with MyChart, go to [email or web address].    Your next appointment:   Follow up as needed   The format for your next appointment:   In Person  Provider:   Brian Agbor-Etang, MD    Other Instructions   Important Information About Sugar       

## 2022-07-27 ENCOUNTER — Telehealth: Payer: Self-pay

## 2022-07-27 DIAGNOSIS — B3731 Acute candidiasis of vulva and vagina: Secondary | ICD-10-CM

## 2022-07-27 MED ORDER — FLUCONAZOLE 150 MG PO TABS: 150.0000 mg | ORAL_TABLET | Freq: Once | ORAL | 0 refills | Status: AC

## 2022-07-27 NOTE — Telephone Encounter (Signed)
Pt called triage line requesting Rx for yeast, she is currently having vaginal discharge and irritation to touch. Denies vaginal odor or itching. Rx diflucan sent, pt aware.

## 2022-07-28 ENCOUNTER — Emergency Department
Admission: EM | Admit: 2022-07-28 | Discharge: 2022-07-28 | Disposition: A | Discharge status: Home / Self Care | Payer: Managed Care, Other (non HMO) | Attending: Emergency Medicine | Admitting: Emergency Medicine

## 2022-07-28 ENCOUNTER — Encounter: Payer: Self-pay | Admitting: Emergency Medicine

## 2022-07-28 DIAGNOSIS — R309 Painful micturition, unspecified: Secondary | ICD-10-CM | POA: Insufficient documentation

## 2022-07-28 DIAGNOSIS — R300 Dysuria: Secondary | ICD-10-CM | POA: Insufficient documentation

## 2022-07-28 DIAGNOSIS — J45909 Unspecified asthma, uncomplicated: Secondary | ICD-10-CM | POA: Insufficient documentation

## 2022-07-28 LAB — WET PREP, GENITAL
Clue Cells Wet Prep HPF POC: NONE SEEN
Sperm: NONE SEEN
Trich, Wet Prep: NONE SEEN
WBC, Wet Prep HPF POC: <10 — AB (ref <10)
Yeast Wet Prep HPF POC: NONE SEEN

## 2022-07-28 LAB — URINALYSIS, ROUTINE W REFLEX MICROSCOPIC
Bacteria, UA: NONE SEEN
Bilirubin Urine: NEGATIVE
Glucose, UA: NEGATIVE mg/dL
Ketones, ur: NEGATIVE mg/dL
Leukocytes,Ua: NEGATIVE
Nitrite: NEGATIVE
Protein, ur: NEGATIVE mg/dL
Specific Gravity, Urine: <1.005 — ABNORMAL LOW (ref 1.005–1.030)
pH: 6 (ref 5.0–8.0)

## 2022-07-28 MED ORDER — DOXYCYCLINE MONOHYDRATE 100 MG PO TABS: 100.0000 mg | ORAL_TABLET | Freq: Two times a day (BID) | ORAL | 0 refills | Status: AC

## 2022-07-28 MED ORDER — CEFTRIAXONE SODIUM 1 G IJ SOLR
500.0000 mg | Freq: Once | INTRAMUSCULAR | Status: AC
  Administered 2022-07-28: 500 mg via INTRAMUSCULAR
  Filled 2022-07-28: qty 10

## 2022-07-28 MED ORDER — METRONIDAZOLE 500 MG PO TABS: 500.0000 mg | ORAL_TABLET | Freq: Two times a day (BID) | ORAL | 0 refills | Status: DC

## 2022-07-28 MED ORDER — TERCONAZOLE 80 MG VA SUPP: 80.0000 mg | Freq: Every day | VAGINAL | 0 refills | Status: DC

## 2022-07-28 NOTE — ED Provider Notes (Addendum)
[facility] Provider Note    Event Date/Time   First MD Initiated Contact with Patient 07/28/22 1957     (approximate)   History   Chief Complaint Dysuria   HPI Robin Mccall is a 26 y.o. female, history of asthma, dysmenorrhea, GERD, presents to the emergency department for evaluation of dysuria x3 days.  She states that she was prescribed fluconazole 3 days ago for suspected yeast infections.  She states that she gets these quite often and asked the OB/GYN to call it in without testing.  She states that she took the fluconazole, however she continues to have burning sensation when she urinates, green/yellow discharge, and foul odor.  Denies fever/chills, myalgias, pelvic pain, vaginal bleeding, diarrhea, abdominal pain, flank pain, nausea/vomiting, diarrhea, or numb/tingling upper or lower extremities.  History Limitations: No limitations.        Physical Exam  Triage Vital Signs: ED Triage Vitals  Enc Vitals Group     BP 07/28/22 1922 117/67     Pulse Rate 07/28/22 1922 89     Resp 07/28/22 1922 18     Temp 07/28/22 1922 98.2 F (36.8 C)     Temp Source 07/28/22 1922 Oral     SpO2 07/28/22 1922 98 %     Weight 07/28/22 1921 141 lb (64 kg)     Height 07/28/22 1921 5\' 3"  (1.6 m)     Head Circumference --      Peak Flow --      Pain Score 07/28/22 1921 6     Pain Loc --      Pain Edu? --      Excl. in [place]? --     Most recent vital signs: Vitals:   07/28/22 1922 07/28/22 2211  BP: 117/67 114/70  Pulse: 89 70  Resp: 18 16  Temp: 98.2 F (36.8 C)   SpO2: 98% 96%    General: Awake, NAD.  Skin: Warm, dry. No rashes or lesions.  Eyes: PERRL. Conjunctivae normal.  CV: Good peripheral perfusion.  Resp: Normal effort.  Abd: Soft, non-tender. No distention.  Neuro: At baseline. No gross neurological deficits.  Musculoskeletal: Normal ROM of all extremities.   Focused Exam: N/A.  Physical Exam    ED Results / Procedures / Treatments   Labs (all labs ordered are listed, but only abnormal results are displayed) Labs Reviewed  WET PREP, GENITAL - Abnormal; Notable for the following components:      Result Value   WBC, Wet Prep HPF POC <10 (*)    All other components within normal limits  URINALYSIS, ROUTINE W REFLEX MICROSCOPIC - Abnormal; Notable for the following components:   Color, Urine YELLOW (*)    APPearance CLEAR (*)    Specific Gravity, Urine <1.005 (*)    Hgb urine dipstick TRACE (*)    All other components within normal limits  CHLAMYDIA/NGC RT PCR ([facility] ONLY)               EKG N/A.   RADIOLOGY  ED Provider Interpretation: N/A.  No results found.  PROCEDURES:  Critical Care performed: N/A.  Procedures    MEDICATIONS ORDERED IN ED: Medications  cefTRIAXone (ROCEPHIN) injection 500 mg (has no administration in time range)     IMPRESSION / MDM / ASSESSMENT AND PLAN / ED COURSE  I reviewed the triage vital signs and the nursing notes.  Differential diagnosis includes, but is not limited to, bacterial vaginosis, vaginal candidiasis, trichomonas, chlamydia/gonorrhea, cystitis.  ED Course Patient appears well, vitals within normal limits.  NAD.  Urinalysis shows trace hemoglobin, otherwise no evidence of infection.  Assessment/Plan Presents with vaginal discharge, dysuria, and foul odor x3 days.  She has already attempted single dose treatment with fluconazole, though this has been unsuccessful.  Urinalysis shows no evidence of UTI.  Wet prep shows no evidence of trichomonas, yeast, or bacterial vaginosis.  Pending chlamydia/gonorrhea testing.  She states that she is ready to go home and will follow-up with the results online.  We will provide her with a single dose IM ceftriaxone treatment here, and give her a prescription for doxycycline to treat empirically.  Advised her to follow-up with her results online. She is amenable to the plan.  Recommend that she  follow-up with her OB/GYN.  Will discharge.  Provided the patient with anticipatory guidance, return precautions, and educational material. Encouraged the patient to return to the emergency department at any time if they begin to experience any new or worsening symptoms. Patient expressed understanding and agreed with the plan.   Patient's presentation is most consistent with acute complicated illness / injury requiring diagnostic workup.       FINAL CLINICAL IMPRESSION(S) / ED DIAGNOSES   Final diagnoses:  Dysuria     Rx / DC Orders   ED Discharge Orders          Ordered    metroNIDAZOLE (FLAGYL) 500 MG tablet  2 times daily,   Status:  Discontinued        07/28/22 2200    terconazole (TERAZOL 3) 80 MG vaginal suppository  Daily at bedtime,   Status:  Discontinued        07/28/22 2200    doxycycline (ADOXA) 100 MG tablet  2 times daily        07/28/22 2232             Note:  This document was prepared using Dragon voice recognition software and may include unintentional dictation errors.   Teodoro Spray, [place] 07/28/22 2206    Teodoro Spray, [place] 07/28/22 2236    Naaman Plummer, MD 07/28/22 [phone]

## 2022-07-28 NOTE — ED Triage Notes (Signed)
Pt presents via POV with complaints of dysuria for the last 3 days. Pt was prescribed Diflucan for frequent yeast infections which she has taken one pill and things have gotten worse. She note experiencing burning with urination and foul odor. Denies CP or SOB.

## 2022-07-28 NOTE — ED Notes (Signed)
Pt completing self swab for chlamydia. Educated. Will send to lab once received.

## 2022-07-28 NOTE — ED Notes (Signed)
See triage note. Pt in NAD. Educated about self-swab collection of wet prep. Pt up to restroom to provide sample now.

## 2022-07-28 NOTE — Discharge Instructions (Addendum)
-  You may follow-up with the results of your chlamydia/gonorrhea screening online.  If positive for chlamydia, please take the full course of the doxycycline as prescribed.  -Please follow-up with your OB/GYN for repeat testing or reevaluation if your symptoms fail to improve.  -Return to the emergency department anytime if you begin to experience any new or worsening symptoms.

## 2022-07-29 LAB — CHLAMYDIA/NGC RT PCR (ARMC ONLY)
Chlamydia Tr: NOT DETECTED
N gonorrhoeae: NOT DETECTED

## 2022-08-01 ENCOUNTER — Ambulatory Visit: Payer: Medicaid Other | Admitting: Obstetrics and Gynecology

## 2022-08-01 NOTE — Progress Notes (Unsigned)
Evelene Croon, MD   No chief complaint on file.   HPI:      [name] is a 26 y.o. 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    Past Surgical History:  Procedure Laterality Date   DIAGNOSTIC LAPAROSCOPY     GANGLION CYST EXCISION Left 12/14/2015   Procedure: REMOVAL GANGLION OF WRIST;  Surgeon: Deeann Saint, MD;  Location: [facility] ORS;  Service: Orthopedics;  Laterality: Left;   laporoscopic abdomen      Family History  Problem Relation Age of Onset   Stroke Maternal Aunt    Bone cancer Maternal Grandfather    Stomach cancer Maternal Great-grandfather    Kidney disease Neg Hx    Bladder Cancer Neg Hx     Social History   Socioeconomic History   Marital status: Single    Spouse name: Not on file   Number of children: Not on file   Years of education: Not on file   Highest education level: Not on file  Occupational History   Not on file  Tobacco Use   Smoking status: Never   Smokeless tobacco: Never  Vaping Use   Vaping Use: Never used  Substance and Sexual Activity   Alcohol use: Not Currently   Drug use: No   Sexual activity: Yes    Birth control/protection: Pill  Other Topics Concern   Not on file  Social History Narrative   Not on file   Social Determinants of Health   Financial Resource Strain: Not on file  Food Insecurity: Not on file  Transportation Needs: Not on file  Physical Activity: Not on file  Stress: Not on file  Social Connections: Not on file  Intimate Partner Violence: Not on file    Outpatient Medications Prior to Visit  Medication Sig Dispense Refill   ALPRAZolam (XANAX) 0.5 MG tablet Take 0.5 mg by mouth at  bedtime.     ARIPiprazole (ABILIFY) 5 MG tablet Take 5 mg by mouth daily.     busPIRone (BUSPAR) 5 MG tablet Take 5 mg by mouth 3 (three) times daily as needed.     doxycycline (ADOXA) 100 MG tablet Take 1 tablet (100 mg total) by mouth 2 (two) times daily for 7 days. 14 tablet 0   escitalopram (LEXAPRO) 20 MG tablet Take 20 mg by mouth daily.     topiramate (TOPAMAX) 25 MG tablet Take 25 mg by mouth at bedtime.     traZODone (DESYREL) 50 MG tablet Take 50 mg by mouth at bedtime.     TRI-LO-MILI 0.18/0.215/0.25 MG-25 MCG tab Take 1 tablet by mouth daily.     venlafaxine XR (EFFEXOR-XR) 37.5 MG 24 hr capsule Take 37.5 mg by mouth daily.     No facility-administered medications prior to visit.      ROS:  Review of Systems BREAST: No symptoms   OBJECTIVE:   Vitals:  LMP 07/20/2022 (Approximate)   Physical Exam  Results: No results found for this or any previous visit (from the past 24 hour(s)).   Assessment/Plan: No diagnosis found.    No orders of the defined types were placed in this encounter.     No follow-ups on file.  Peityn Payton B. Chianne Byrns, PA-C 08/01/2022 3:42 PM

## 2022-08-02 ENCOUNTER — Encounter: Payer: Self-pay | Admitting: Obstetrics and Gynecology

## 2022-08-02 ENCOUNTER — Ambulatory Visit (INDEPENDENT_AMBULATORY_CARE_PROVIDER_SITE_OTHER): Payer: Self-pay | Admitting: Obstetrics and Gynecology

## 2022-08-02 VITALS — BP 100/62 | Ht 63.0 in | Wt 143.0 lb

## 2022-08-02 DIAGNOSIS — N898 Other specified noninflammatory disorders of vagina: Secondary | ICD-10-CM

## 2022-08-02 DIAGNOSIS — N644 Mastodynia: Secondary | ICD-10-CM

## 2022-08-02 DIAGNOSIS — R300 Dysuria: Secondary | ICD-10-CM

## 2022-08-02 LAB — POCT WET PREP WITH KOH
Clue Cells Wet Prep HPF POC: NEGATIVE
KOH Prep POC: NEGATIVE
Trichomonas, UA: NEGATIVE
Yeast Wet Prep HPF POC: NEGATIVE

## 2022-08-02 LAB — POCT URINALYSIS DIPSTICK
Bilirubin, UA: NEGATIVE
Blood, UA: NEGATIVE
Glucose, UA: NEGATIVE
Ketones, UA: NEGATIVE
Leukocytes, UA: NEGATIVE
Nitrite, UA: NEGATIVE
Protein, UA: NEGATIVE
Spec Grav, UA: 1.025 (ref 1.010–1.025)
pH, UA: 6 (ref 5.0–8.0)

## 2022-08-02 NOTE — Patient Instructions (Signed)
I value your feedback and you entrusting us with your care. If you get a [facility] patient survey, I would appreciate you taking the time to let us know about your experience today. Thank you! ? ? ?

## 2022-08-05 LAB — NUSWAB VAGINITIS (VG)
Candida albicans, NAA: NEGATIVE
Candida glabrata, NAA: NEGATIVE
Trich vag by NAA: NEGATIVE

## 2022-09-17 ENCOUNTER — Other Ambulatory Visit: Payer: Self-pay

## 2022-09-17 ENCOUNTER — Emergency Department
Admission: EM | Admit: 2022-09-17 | Discharge: 2022-09-17 | Disposition: A | Discharge status: Home / Self Care | Payer: Medicaid Other | Attending: Emergency Medicine | Admitting: Emergency Medicine

## 2022-09-17 DIAGNOSIS — X500XXA Overexertion from strenuous movement or load, initial encounter: Secondary | ICD-10-CM | POA: Insufficient documentation

## 2022-09-17 DIAGNOSIS — M62830 Muscle spasm of back: Secondary | ICD-10-CM

## 2022-09-17 DIAGNOSIS — M542 Cervicalgia: Secondary | ICD-10-CM | POA: Insufficient documentation

## 2022-09-17 DIAGNOSIS — M545 Low back pain, unspecified: Secondary | ICD-10-CM

## 2022-09-17 MED ORDER — LIDOCAINE 5 % EX PTCH: 1.0000 | MEDICATED_PATCH | Freq: Two times a day (BID) | CUTANEOUS | 0 refills | Status: AC

## 2022-09-17 MED ORDER — KETOROLAC TROMETHAMINE 15 MG/ML IJ SOLN
15.0000 mg | Freq: Once | INTRAMUSCULAR | Status: AC
Start: 2022-09-17 — End: 2022-09-17
  Administered 2022-09-17: 15 mg via INTRAMUSCULAR
  Filled 2022-09-17: qty 1

## 2022-09-17 MED ORDER — CYCLOBENZAPRINE HCL 5 MG PO TABS: 5.0000 mg | ORAL_TABLET | Freq: Every day | ORAL | 0 refills | Status: DC

## 2022-09-17 MED ORDER — LIDOCAINE 5 % EX PTCH
1.0000 | MEDICATED_PATCH | CUTANEOUS | Status: DC
  Administered 2022-09-17: 1 via TRANSDERMAL
  Filled 2022-09-17: qty 1

## 2022-09-17 NOTE — ED Triage Notes (Signed)
Pt comes in from home. Pt has had neck pain x 3 weeks and back pain x 2 weeks. Both have been constant. Pt denies any trauma. Pt advised she has not seen her PCP for this. She has taken OTC meds with no relief. Nothing makes it better or worse. Pt has pain when she turns her neck. 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 Pt does not appear to be in any acute distress.

## 2022-09-17 NOTE — ED Provider Notes (Signed)
[facility] Provider Note    Event Date/Time   First MD Initiated Contact with Patient 09/17/22 1524     (approximate)   History   Back Pain and Neck Pain   HPI  Robin Mccall is a 26 y.o. female who presents today for evaluation of back and neck pain for the past couple of weeks.  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  She denies any acute injury.  She denies numbness or tingling.  No chest pain or abdominal pain.  No dysuria.  She has not been taking anything for her symptoms.  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 --      Excl. in [place]? --     Most recent vital signs: Vitals:   09/17/22 1524  BP: 115/66  Pulse: 70  Resp: 16  Temp: 98.1 F (36.7 C)  SpO2: 95%    Physical Exam Vitals and nursing note reviewed.  Constitutional:      General: Awake and alert. No acute distress.    Appearance: Normal appearance. The patient is normal weight.  HENT:     Head: Normocephalic and atraumatic.     Mouth: Mucous membranes are moist.  Eyes:     General: PERRL. Normal EOMs        Right eye: No discharge.        Left eye: No discharge.     Conjunctiva/sclera: Conjunctivae normal.  Cardiovascular:     Rate and Rhythm: Normal rate and regular rhythm.      Pulses: Normal pulses.     Heart sounds: Normal heart sounds Pulmonary:     Effort: Pulmonary effort is normal. No respiratory distress.     Breath sounds: Normal breath sounds.  Abdominal:     Abdomen is soft. There is no abdominal tenderness. No rebound or guarding. No distention. Musculoskeletal:        General: No swelling. Normal range of motion.     Cervical back: Normal range of motion and neck supple.  No midline cervical spine tenderness.  Full range of motion of neck.  Negative Spurling test.  Negative Lhermitte sign.  Normal strength and sensation in bilateral upper extremities. Normal grip strength bilaterally.  Normal intrinsic muscle function of the hand bilaterally.  Normal radial pulses bilaterally. Back: No midline tenderness. Strength and sensation 5/5 to bilateral lower extremities. Normal great toe extension against resistance. Normal sensation throughout feet. Normal patellar reflexes. Negative SLR and opposite SLR  bilaterally. Negative FABER test Skin:    General: Skin is warm and dry.     Capillary Refill: Capillary refill takes less than 2 seconds.     Findings: No rash.  Neurological:     Mental Status: The patient is awake and alert.  Neurological: GCS 15 alert and oriented x3 Normal speech, no expressive or receptive aphasia or dysarthria Cranial nerves II through XII intact Normal visual fields 5 out of 5 strength in all 4 extremities with intact sensation throughout No extremity drift Normal finger-to-nose testing, no limb or truncal ataxia     ED Results / Procedures / Treatments   Labs (all labs ordered are listed, but only abnormal results are displayed) Labs Reviewed - No data to display   EKG     RADIOLOGY     PROCEDURES:  Critical Care performed:   Procedures   MEDICATIONS ORDERED IN ED: Medications  lidocaine (LIDODERM) 5 % 1 patch (1 patch Transdermal Patch Applied 09/17/22 1543)  ketorolac (TORADOL) 15 MG/ML injection 15 mg  (15 mg Intramuscular Given 09/17/22 1545)     IMPRESSION / MDM / ASSESSMENT AND PLAN / ED COURSE  I reviewed the triage vital signs and the nursing notes.   Differential diagnosis includes, but is not limited to, muscle spasm, strain, less likely cervical or lumbar radiculopathy.  Patient is awake and alert, hemodynamically stable and afebrile.  She is got diffuse musculoskeletal tenderness in the setting of lifting heavy toddlers and moving furniture.  No chest pain, hemoptysis, lower extremity swelling or history of PE/DVT to suggest PE as a source of back pain.  Normal pulses in all 4 extremities, not consistent with vascular catastrophe.  She has no midline vertebral tenderness throughout her back.  She is got normal strength and sensation in her lower extremities and upper extremities.  No constitutional symptoms to suggest infection.  Palpable muscle spasms.  Negative Spurling and Lhermitte sign, normal strength and sensation in bilateral arms, normal grip strength.  We discussed muscle relaxants and Tylenol/Motrin and Lidoderm patches as well as gentle stretching and warm compresses/massage.  She was treated symptomatically with Toradol and Lidoderm patch.  She declined pregnancy test prior to Toradol, reports that there is no chance of pregnancy and does not wish to go home with a pregnancy test.  We also discussed strict return precautions and the importance of close outpatient follow-up.  Patient understands and agrees with plan.  She was discharged in stable condition.   Patient's presentation is most consistent with acute illness / injury with system symptoms.      FINAL CLINICAL IMPRESSION(S) / ED DIAGNOSES   Final diagnoses:  Acute bilateral low back pain without sciatica  Neck pain  Muscle spasm of back     Rx / DC Orders   ED Discharge Orders          Ordered    cyclobenzaprine (FLEXERIL) 5 MG tablet  Daily at bedtime        09/17/22 1541    lidocaine (LIDODERM) 5 %   Every 12 hours        09/17/22 1541             Note:  This document was prepared using Dragon voice recognition software and may include unintentional dictation errors.   Keturah Shavers 09/17/22 1817    Shaune Pollack, MD 09/17/22 2158

## 2022-09-17 NOTE — ED Notes (Signed)
Pt does not want to wait the 15 min med hold time.

## 2022-09-17 NOTE — Discharge Instructions (Signed)
You may take Tylenol and/or ibuprofen to help with your symptoms.  You may also take the Flexeril, but remember that you cannot drive, operate heavy machinery, go to work, or perform tasks require concentration while taking this medication as it can impair your judgment or make you sleepy.  Please return for any new, worsening, or change in symptoms or other concerns.

## 2022-09-17 NOTE — ED Triage Notes (Signed)
Pt describes muscular pain when moving.

## 2022-11-28 ENCOUNTER — Encounter: Payer: Self-pay | Admitting: Obstetrics and Gynecology

## 2022-11-28 ENCOUNTER — Ambulatory Visit: Payer: Medicaid Other | Admitting: Obstetrics and Gynecology

## 2022-11-28 VITALS — BP 110/60 | Ht 63.0 in | Wt 146.0 lb

## 2022-11-28 DIAGNOSIS — Z3202 Encounter for pregnancy test, result negative: Secondary | ICD-10-CM | POA: Diagnosis not present

## 2022-11-28 DIAGNOSIS — R590 Localized enlarged lymph nodes: Secondary | ICD-10-CM | POA: Diagnosis not present

## 2022-11-28 DIAGNOSIS — N921 Excessive and frequent menstruation with irregular cycle: Secondary | ICD-10-CM

## 2022-11-28 DIAGNOSIS — N912 Amenorrhea, unspecified: Secondary | ICD-10-CM

## 2022-11-28 LAB — POCT URINE PREGNANCY: Preg Test, Ur: NEGATIVE

## 2022-11-28 NOTE — Progress Notes (Signed)
Robin Market, MD   Chief Complaint  Patient presents with   Vaginal Bleeding    Spotting since 1/13, feels knot in right side of pelvic area, painful     HPI:      [name] is a 27 y.o. G0P0000 whose LMP was Patient's last menstrual period was 11/19/2022 (exact date)., presents today for BTB on OCPs this past pill pack. Her menses are regular every 28-30 days, lasting 3-4 days but usually start mid to end of 3rd wk of pills with no bleeding on placebo pills.  Dysmenorrhea mild. Bleeding started earlier this pill pack and has continued for 2 wks, light to spotting only, originally with mild dysmen but not now. No late/missed pills. Pt was supposedly getting wrong Rx at [facility] for tri sprintec Lo, but just changed back to tri sprintec 2 days ago with placebo pills.  Neg STD testing 9/23, no new partners.  Pt also noticed painful knot in RT inguinal area about a week ago. Sx started around time of shaving mons. No other cuts/bites/scratches on legs, no vaginal lesions.   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    Past Surgical History:  Procedure Laterality Date   DIAGNOSTIC LAPAROSCOPY     GANGLION CYST EXCISION Left 12/14/2015   Procedure: REMOVAL GANGLION OF WRIST;  Surgeon: Earnestine Leys, MD;  Location: [facility] ORS;  Service: Orthopedics;  Laterality: Left;   laporoscopic abdomen      Family History  Problem Relation Age of Onset   Stroke Maternal Aunt    Bone cancer Maternal Grandfather    Stomach cancer Maternal Great-grandfather    Kidney disease Neg Hx    Bladder Cancer Neg Hx     Social History   Socioeconomic History   Marital status: Single    Spouse name: Not on file   Number of children: Not on file   Years of education: Not on file   Highest education level: Not on file   Occupational History   Not on file  Tobacco Use   Smoking status: Never   Smokeless tobacco: Never  Vaping Use   Vaping Use: Never used  Substance and Sexual Activity   Alcohol use: Not Currently   Drug use: No   Sexual activity: Yes    Birth control/protection: Pill  Other Topics Concern   Not on file  Social History Narrative   Not on file   Social Determinants of Health   Financial Resource Strain: Not on file  Food Insecurity: Not on file  Transportation Needs: Not on file  Physical Activity: Not on file  Stress: Not on file  Social Connections: Not on file  Intimate Partner Violence: Not on file    Outpatient Medications Prior to Visit  Medication Sig Dispense Refill   methocarbamol (ROBAXIN) 750 MG tablet Take 1,500 mg by mouth 4 (four) times daily.     TRI-SPRINTEC 0.18/0.215/0.25 MG-35 MCG tablet Take 1 tablet by mouth daily.     ALPRAZolam (XANAX) 0.5 MG tablet Take 0.5 mg by mouth at bedtime. (Patient not taking: Reported on 08/02/2022)     ARIPiprazole (ABILIFY) 5 MG tablet Take 5 mg by mouth daily. (Patient not taking: Reported on 08/02/2022)     busPIRone (BUSPAR) 5 MG tablet Take 5 mg by mouth 3 (three) times daily as needed. (  Patient not taking: Reported on 08/02/2022)     cyclobenzaprine (FLEXERIL) 5 MG tablet Take 1 tablet (5 mg total) by mouth at bedtime. 5 tablet 0   escitalopram (LEXAPRO) 20 MG tablet Take 20 mg by mouth daily. (Patient not taking: Reported on 08/02/2022)     nystatin cream (MYCOSTATIN) Apply topically 2 (two) times daily.     topiramate (TOPAMAX) 25 MG tablet Take 25 mg by mouth at bedtime. (Patient not taking: Reported on 08/02/2022)     traZODone (DESYREL) 50 MG tablet Take 50 mg by mouth at bedtime.     TRI-LO-MILI 0.18/0.215/0.25 MG-25 MCG tab Take 1 tablet by mouth daily.     venlafaxine XR (EFFEXOR-XR) 37.5 MG 24 hr capsule Take 37.5 mg by mouth daily. (Patient not taking: Reported on 08/02/2022)     No facility-administered  medications prior to visit.      ROS:  Review of Systems  Constitutional:  Negative for fever.  Gastrointestinal:  Negative for blood in stool, constipation, diarrhea, nausea and vomiting.  Genitourinary:  Positive for menstrual problem. Negative for dyspareunia, dysuria, flank pain, frequency, hematuria, urgency, vaginal bleeding, vaginal discharge and vaginal pain.  Musculoskeletal:  Negative for back pain.  Skin:  Negative for rash.   BREAST: No symptoms   OBJECTIVE:   Vitals:  BP 110/60   Ht 5\' 3"  (1.6 m)   Wt 146 lb (66.2 kg)   LMP 11/19/2022 (Exact Date)   BMI 25.86 kg/m   Physical Exam Vitals reviewed.  Constitutional:      Appearance: She is well-developed.  Pulmonary:     Effort: Pulmonary effort is normal.  Abdominal:     Hernia: No hernia is present. There is no hernia in the right inguinal area.       Comments: FOLLICULITIS MONS FROM SHAVING  Genitourinary:    General: Normal vulva.     Pubic Area: No rash.      Labia:        Right: No rash, tenderness or lesion.        Left: No rash, tenderness or lesion.      Vagina: Bleeding present. No vaginal discharge, erythema or tenderness.     Cervix: Normal.     Uterus: Normal. Not enlarged and not tender.      Adnexa: Right adnexa normal and left adnexa normal.       Right: No mass or tenderness.         Left: No mass or tenderness.       Comments: SCANT AMT BLEEDING AT CX OS Musculoskeletal:        General: Normal range of motion.     Cervical back: Normal range of motion.  Lymphadenopathy:     Lower Body: Right inguinal adenopathy present.  Skin:    General: Skin is warm and dry.  Neurological:     General: No focal deficit present.     Mental Status: She is alert and oriented to person, place, and time.  Psychiatric:        Mood and Affect: Mood normal.        Behavior: Behavior normal.        Thought Content: Thought content normal.        Judgment: Judgment normal.     Results: Results  for orders placed or performed in visit on 11/28/22 (from the past 24 hour(s))  POCT urine pregnancy     Status: Normal   Collection Time: 11/28/22  5:30 PM  Result  Value Ref Range   Preg Test, Ur Negative Negative     Assessment/Plan: Breakthrough bleeding on OCPs - Plan: POCT urine pregnancy; neg UPT, neg STD testing 9/23, no new partners. Reassurance. F/u prn. Pt also just changed OCPs 2 days ago so may have continued BTB for the next couple pill packs. Will f/u at 4/24 annual/sooner prn heavy bleeding.   Lymphadenopathy, inguinal--Right. May be related to folliculitis after shaving.  NSAIDs/ f/u for further eval with labs/u/s if sx worsen.     Return if symptoms worsen or fail to improve.  Jenah Vanasten B. Iverson Sees, PA-C 11/28/2022 5:32 PM

## 2023-01-04 ENCOUNTER — Other Ambulatory Visit: Payer: Self-pay

## 2023-01-04 ENCOUNTER — Emergency Department
Admission: EM | Admit: 2023-01-04 | Discharge: 2023-01-04 | Disposition: A | Discharge status: Home / Self Care | Payer: Medicaid Other | Attending: Emergency Medicine | Admitting: Emergency Medicine

## 2023-01-04 ENCOUNTER — Encounter: Payer: Self-pay | Admitting: Intensive Care

## 2023-01-04 DIAGNOSIS — N898 Other specified noninflammatory disorders of vagina: Secondary | ICD-10-CM | POA: Diagnosis present

## 2023-01-04 DIAGNOSIS — Z202 Contact with and (suspected) exposure to infections with a predominantly sexual mode of transmission: Secondary | ICD-10-CM | POA: Insufficient documentation

## 2023-01-04 DIAGNOSIS — N3000 Acute cystitis without hematuria: Secondary | ICD-10-CM | POA: Diagnosis not present

## 2023-01-04 LAB — URINALYSIS, ROUTINE W REFLEX MICROSCOPIC
Bacteria, UA: NONE SEEN
Bilirubin Urine: NEGATIVE
Glucose, UA: NEGATIVE mg/dL
Hgb urine dipstick: NEGATIVE
Ketones, ur: NEGATIVE mg/dL
Nitrite: NEGATIVE
Protein, ur: NEGATIVE mg/dL
Specific Gravity, Urine: 1.019 (ref 1.005–1.030)
WBC, UA: >50 WBC/hpf (ref 0–5)
pH: 6 (ref 5.0–8.0)

## 2023-01-04 LAB — CHLAMYDIA/NGC RT PCR (ARMC ONLY)
Chlamydia Tr: DETECTED — AB
N gonorrhoeae: DETECTED — AB

## 2023-01-04 LAB — WET PREP, GENITAL
Clue Cells Wet Prep HPF POC: NONE SEEN
Sperm: NONE SEEN
Trich, Wet Prep: NONE SEEN
WBC, Wet Prep HPF POC: <10 (ref <10)
Yeast Wet Prep HPF POC: NONE SEEN

## 2023-01-04 LAB — POC URINE PREG, ED: Preg Test, Ur: NEGATIVE

## 2023-01-04 MED ORDER — CEPHALEXIN 500 MG PO CAPS: 500.0000 mg | ORAL_CAPSULE | Freq: Three times a day (TID) | ORAL | 0 refills | Status: AC

## 2023-01-04 MED ORDER — CEFTRIAXONE SODIUM 1 G IJ SOLR
500.0000 mg | Freq: Once | INTRAMUSCULAR | Status: AC
  Administered 2023-01-04: 500 mg via INTRAMUSCULAR
  Filled 2023-01-04: qty 10

## 2023-01-04 MED ORDER — AZITHROMYCIN 500 MG PO TABS
1000.0000 mg | ORAL_TABLET | Freq: Once | ORAL | Status: AC
  Administered 2023-01-04: 1000 mg via ORAL
  Filled 2023-01-04: qty 2

## 2023-01-04 MED ORDER — FLUCONAZOLE 150 MG PO TABS: 150.0000 mg | ORAL_TABLET | Freq: Once | ORAL | 0 refills | Status: AC

## 2023-01-04 NOTE — Discharge Instructions (Addendum)
Your symptoms are consistent with a UTI and STD exposure you been treated for gonorrhea and chlamydia in the ED.  Aberman test are negative at this time.  Follow-up with primary provider for ongoing symptoms.  Avoid any sexual contact until you and your partner have been treated and are symptom-free.

## 2023-01-04 NOTE — ED Triage Notes (Signed)
Patient is having painful urination and yellow discharge that started Saturday.

## 2023-01-04 NOTE — ED Provider Notes (Signed)
[facility] Emergency Department Provider Note     Event Date/Time   First MD Initiated Contact with Patient 01/04/23 1837     (approximate)   History   Vaginal Discharge   HPI  Robin Mccall is a 27 y.o. female presents to the ED for evaluation of painful urination and yellowish vaginal discharge patient reports onset of symptoms 4 days prior to arrival.  Concern for STD exposure after an encounter with her boyfriend.  He noted some penile discharge and dysuria.     Physical Exam   Triage Vital Signs: ED Triage Vitals  Enc Vitals Group     BP 01/04/23 1711 122/72     Pulse Rate 01/04/23 1711 71     Resp 01/04/23 1711 16     Temp 01/04/23 1711 97.6 F (36.4 C)     Temp Source 01/04/23 1711 Oral     SpO2 01/04/23 1711 94 %     Weight 01/04/23 1712 145 lb (65.8 kg)     Height 01/04/23 1712 '5\' 3"'$  (1.6 m)     Head Circumference --      Peak Flow --      Pain Score 01/04/23 1712 1     Pain Loc --      Pain Edu? --      Excl. in [place]? --     Most recent vital signs: Vitals:   01/04/23 1711 01/04/23 2019  BP: 122/72 128/70  Pulse: 71 77  Resp: 16 18  Temp: 97.6 F (36.4 C) 97.8 F (36.6 C)  SpO2: 94% 95%    General Awake, no distress. NAD HEENT NCAT. PERRL. EOMI. No rhinorrhea. Mucous membranes are moist.  CV:  Good peripheral perfusion.  RESP:  Normal effort.  ABD:  No distention.  GU:  Deferred by patient. Patient-collected swabs   ED Results / Procedures / Treatments   Labs (all labs ordered are listed, but only abnormal results are displayed) Labs Reviewed  CHLAMYDIA/NGC RT PCR ([facility] ONLY)           - Abnormal; Notable for the following components:      Result Value   Chlamydia Tr DETECTED (*)    N gonorrhoeae DETECTED (*)    All other components within normal limits  URINALYSIS, ROUTINE W REFLEX MICROSCOPIC - Abnormal; Notable for the following components:   Color, Urine YELLOW (*)    APPearance HAZY (*)     Leukocytes,Ua LARGE (*)    All other components within normal limits  WET PREP, GENITAL  POC URINE PREG, ED    EKG   RADIOLOGY   No results found.   PROCEDURES:  Critical Care performed: No  Procedures   MEDICATIONS ORDERED IN ED: Medications  azithromycin (ZITHROMAX) tablet 1,000 mg (1,000 mg Oral Given 01/04/23 1946)  cefTRIAXone (ROCEPHIN) injection 500 mg (500 mg Intramuscular Given 01/04/23 1947)     IMPRESSION / MDM / ASSESSMENT AND PLAN / ED COURSE  I reviewed the triage vital signs and the nursing notes.                              Differential diagnosis includes, but is not limited to, UTI, vaginitis, cervicitis, pregnancy  Patient's presentation is most consistent with acute complicated illness / injury requiring diagnostic workup.  Patient's diagnosis is consistent with vaginitis/cervicitis due to gonorrhea and chlamydia.  Patient received doses of Rocephin and patient will be  discharged home with after empiric treatment with IM Rocephin and p.o. azithromycin. Patient is to follow up with primary provider or [place] as needed or otherwise directed. Patient is given ED precautions to return to the ED for any worsening or new symptoms.  FINAL CLINICAL IMPRESSION(S) / ED DIAGNOSES   Final diagnoses:  Exposure to sexually transmitted disease (STD)  Acute cystitis without hematuria     Rx / DC Orders   ED Discharge Orders          Ordered    cephALEXin (KEFLEX) 500 MG capsule  3 times daily        01/04/23 1954    fluconazole (DIFLUCAN) 150 MG tablet   Once        01/04/23 2007             Note:  This document was prepared using Dragon voice recognition software and may include unintentional dictation errors.    Melvenia Needles, PA-C 01/04/23 2316    Naaman Plummer, MD 01/04/23 2322

## 2023-02-09 ENCOUNTER — Ambulatory Visit: Payer: Medicaid Other | Admitting: Obstetrics and Gynecology

## 2023-03-10 NOTE — Progress Notes (Signed)
This encounter was created in error - please disregard.

## 2023-06-06 ENCOUNTER — Encounter: Payer: Self-pay | Admitting: Emergency Medicine

## 2023-06-06 ENCOUNTER — Other Ambulatory Visit: Payer: Self-pay

## 2023-06-06 DIAGNOSIS — K3533 Acute appendicitis with perforation and localized peritonitis, with abscess: Secondary | ICD-10-CM | POA: Diagnosis not present

## 2023-06-06 DIAGNOSIS — K353 Acute appendicitis with localized peritonitis, without perforation or gangrene: Secondary | ICD-10-CM | POA: Diagnosis present

## 2023-06-06 LAB — CBC WITH DIFFERENTIAL/PLATELET
Abs Immature Granulocytes: 0.02 10*3/uL (ref 0.00–0.07)
Basophils Absolute: 0.1 10*3/uL (ref 0.0–0.1)
Basophils Relative: 1 %
Eosinophils Absolute: 0.3 10*3/uL (ref 0.0–0.5)
Eosinophils Relative: 3 %
HCT: 39.8 % (ref 36.0–46.0)
Hemoglobin: 13.2 g/dL (ref 12.0–15.0)
Immature Granulocytes: 0 %
Lymphocytes Relative: 27 %
Lymphs Abs: 2.9 10*3/uL (ref 0.7–4.0)
MCH: 28.4 pg (ref 26.0–34.0)
MCHC: 33.2 g/dL (ref 30.0–36.0)
MCV: 85.8 fL (ref 80.0–100.0)
Monocytes Absolute: 0.7 10*3/uL (ref 0.1–1.0)
Monocytes Relative: 6 %
Neutro Abs: 7 10*3/uL (ref 1.7–7.7)
Neutrophils Relative %: 63 %
Platelets: 336 10*3/uL (ref 150–400)
RBC: 4.64 MIL/uL (ref 3.87–5.11)
RDW: 12.7 % (ref 11.5–15.5)
WBC: 11 10*3/uL — ABNORMAL HIGH (ref 4.0–10.5)
nRBC: 0 % (ref 0.0–0.2)

## 2023-06-06 NOTE — ED Triage Notes (Signed)
Patient ambulatory to triage with steady gait, without difficulty or distress noted; pt reports left sided abd pain for several hrs, V x 2

## 2023-06-07 ENCOUNTER — Emergency Department: Payer: Medicaid Other | Admitting: Anesthesiology

## 2023-06-07 ENCOUNTER — Ambulatory Visit
Admission: EM | Admit: 2023-06-07 | Discharge: 2023-06-07 | Disposition: A | Discharge status: Home / Self Care | Payer: Medicaid Other | Attending: Emergency Medicine | Admitting: Emergency Medicine

## 2023-06-07 ENCOUNTER — Emergency Department: Payer: Medicaid Other

## 2023-06-07 ENCOUNTER — Encounter
Admission: EM | Disposition: A | Discharge status: Home / Self Care | Payer: Self-pay | Source: Home / Self Care | Attending: Emergency Medicine

## 2023-06-07 ENCOUNTER — Ambulatory Visit: Admit: 2023-06-07 | Payer: No Typology Code available for payment source | Admitting: Surgery

## 2023-06-07 ENCOUNTER — Encounter: Payer: Self-pay | Admitting: *Deleted

## 2023-06-07 ENCOUNTER — Other Ambulatory Visit: Payer: Self-pay

## 2023-06-07 DIAGNOSIS — K3580 Unspecified acute appendicitis: Secondary | ICD-10-CM

## 2023-06-07 DIAGNOSIS — K353 Acute appendicitis with localized peritonitis, without perforation or gangrene: Secondary | ICD-10-CM

## 2023-06-07 HISTORY — PX: LAPAROSCOPIC APPENDECTOMY: SHX408

## 2023-06-07 SURGERY — APPENDECTOMY, LAPAROSCOPIC
Anesthesia: General

## 2023-06-07 MED ORDER — ROCURONIUM BROMIDE 100 MG/10ML IV SOLN
INTRAVENOUS | Status: DC | PRN
  Administered 2023-06-07: 40 mg via INTRAVENOUS

## 2023-06-07 MED ORDER — ONDANSETRON HCL 4 MG/2ML IJ SOLN: 4.0000 mg | Freq: Four times a day (QID) | INTRAMUSCULAR | Status: DC | PRN

## 2023-06-07 MED ORDER — PIPERACILLIN-TAZOBACTAM 3.375 G IVPB 30 MIN
3.3750 g | Freq: Once | INTRAVENOUS | Status: AC
  Administered 2023-06-07: 3.375 g via INTRAVENOUS
  Filled 2023-06-07: qty 50

## 2023-06-07 MED ORDER — FENTANYL CITRATE (PF) 100 MCG/2ML IJ SOLN: 25.0000 ug | INTRAMUSCULAR | Status: DC | PRN

## 2023-06-07 MED ORDER — ONDANSETRON HCL 4 MG/2ML IJ SOLN
INTRAMUSCULAR | Status: AC
  Filled 2023-06-07: qty 2

## 2023-06-07 MED ORDER — PHENYLEPHRINE 80 MCG/ML (10ML) SYRINGE FOR IV PUSH (FOR BLOOD PRESSURE SUPPORT)
PREFILLED_SYRINGE | INTRAVENOUS | Status: AC
  Filled 2023-06-07: qty 10

## 2023-06-07 MED ORDER — KETOROLAC TROMETHAMINE 30 MG/ML IJ SOLN
INTRAMUSCULAR | Status: AC
  Filled 2023-06-07: qty 1

## 2023-06-07 MED ORDER — DEXAMETHASONE SODIUM PHOSPHATE 10 MG/ML IJ SOLN
INTRAMUSCULAR | Status: AC
  Filled 2023-06-07: qty 1

## 2023-06-07 MED ORDER — BUPIVACAINE-EPINEPHRINE (PF) 0.25% -1:200000 IJ SOLN
INTRAMUSCULAR | Status: AC
  Filled 2023-06-07: qty 30

## 2023-06-07 MED ORDER — IOHEXOL 300 MG/ML  SOLN
100.0000 mL | Freq: Once | INTRAMUSCULAR | Status: AC | PRN
  Administered 2023-06-07: 100 mL via INTRAVENOUS

## 2023-06-07 MED ORDER — FENTANYL CITRATE (PF) 100 MCG/2ML IJ SOLN
INTRAMUSCULAR | Status: AC
  Filled 2023-06-07: qty 2

## 2023-06-07 MED ORDER — DEXAMETHASONE SODIUM PHOSPHATE 10 MG/ML IJ SOLN
INTRAMUSCULAR | Status: DC | PRN
  Administered 2023-06-07: 6 mg via INTRAVENOUS

## 2023-06-07 MED ORDER — KETOROLAC TROMETHAMINE 30 MG/ML IJ SOLN
INTRAMUSCULAR | Status: DC | PRN
  Administered 2023-06-07: 30 mg via INTRAVENOUS

## 2023-06-07 MED ORDER — IBUPROFEN 800 MG PO TABS: 800.0000 mg | ORAL_TABLET | Freq: Three times a day (TID) | ORAL | 0 refills | Status: DC | PRN

## 2023-06-07 MED ORDER — EPHEDRINE SULFATE (PRESSORS) 50 MG/ML IJ SOLN
INTRAMUSCULAR | Status: DC | PRN
  Administered 2023-06-07: 10 mg via INTRAVENOUS
  Administered 2023-06-07: 5 mg via INTRAVENOUS

## 2023-06-07 MED ORDER — OXYCODONE HCL 5 MG PO TABS
5.0000 mg | ORAL_TABLET | Freq: Once | ORAL | Status: AC | PRN
  Administered 2023-06-07: 5 mg via ORAL

## 2023-06-07 MED ORDER — ROCURONIUM BROMIDE 10 MG/ML (PF) SYRINGE
PREFILLED_SYRINGE | INTRAVENOUS | Status: AC
  Filled 2023-06-07: qty 20

## 2023-06-07 MED ORDER — HYDROCODONE-ACETAMINOPHEN 5-325 MG PO TABS: 1.0000 | ORAL_TABLET | Freq: Four times a day (QID) | ORAL | 0 refills | Status: DC | PRN

## 2023-06-07 MED ORDER — LIDOCAINE HCL (PF) 2 % IJ SOLN
INTRAMUSCULAR | Status: AC
  Filled 2023-06-07: qty 5

## 2023-06-07 MED ORDER — OXYCODONE HCL 5 MG PO TABS
ORAL_TABLET | ORAL | Status: AC
  Filled 2023-06-07: qty 1

## 2023-06-07 MED ORDER — KETOROLAC TROMETHAMINE 30 MG/ML IJ SOLN
15.0000 mg | Freq: Once | INTRAMUSCULAR | Status: AC
  Administered 2023-06-07: 15 mg via INTRAVENOUS
  Filled 2023-06-07: qty 1

## 2023-06-07 MED ORDER — OXYCODONE HCL 5 MG/5ML PO SOLN: 5.0000 mg | Freq: Once | ORAL | Status: AC | PRN

## 2023-06-07 MED ORDER — MIDAZOLAM HCL 2 MG/2ML IJ SOLN
INTRAMUSCULAR | Status: AC
  Filled 2023-06-07: qty 2

## 2023-06-07 MED ORDER — MIDAZOLAM HCL 2 MG/2ML IJ SOLN
INTRAMUSCULAR | Status: DC | PRN
  Administered 2023-06-07: 2 mg via INTRAVENOUS

## 2023-06-07 MED ORDER — HYDROMORPHONE HCL 1 MG/ML IJ SOLN: 0.5000 mg | INTRAMUSCULAR | Status: DC | PRN

## 2023-06-07 MED ORDER — SODIUM CHLORIDE 0.9 % IV BOLUS
1000.0000 mL | Freq: Once | INTRAVENOUS | Status: AC
  Administered 2023-06-07: 1000 mL via INTRAVENOUS

## 2023-06-07 MED ORDER — ACETAMINOPHEN 10 MG/ML IV SOLN
INTRAVENOUS | Status: AC
  Filled 2023-06-07: qty 100

## 2023-06-07 MED ORDER — LIDOCAINE HCL (CARDIAC) PF 100 MG/5ML IV SOSY
PREFILLED_SYRINGE | INTRAVENOUS | Status: DC | PRN
  Administered 2023-06-07: 50 mg via INTRAVENOUS

## 2023-06-07 MED ORDER — DROPERIDOL 2.5 MG/ML IJ SOLN
0.6250 mg | Freq: Once | INTRAMUSCULAR | Status: AC
  Administered 2023-06-07: 0.625 mg via INTRAVENOUS

## 2023-06-07 MED ORDER — SUGAMMADEX SODIUM 200 MG/2ML IV SOLN
INTRAVENOUS | Status: DC | PRN
  Administered 2023-06-07: 300 mg via INTRAVENOUS

## 2023-06-07 MED ORDER — DROPERIDOL 2.5 MG/ML IJ SOLN
INTRAMUSCULAR | Status: AC
  Filled 2023-06-07: qty 2

## 2023-06-07 MED ORDER — ONDANSETRON HCL 4 MG/2ML IJ SOLN
INTRAMUSCULAR | Status: DC | PRN
  Administered 2023-06-07: 4 mg via INTRAVENOUS

## 2023-06-07 MED ORDER — FENTANYL CITRATE (PF) 100 MCG/2ML IJ SOLN
INTRAMUSCULAR | Status: DC | PRN
  Administered 2023-06-07 (×2): 50 ug via INTRAVENOUS

## 2023-06-07 MED ORDER — PHENYLEPHRINE HCL (PRESSORS) 10 MG/ML IV SOLN
INTRAVENOUS | Status: DC | PRN
  Administered 2023-06-07: 160 ug via INTRAVENOUS
  Administered 2023-06-07: 80 ug via INTRAVENOUS

## 2023-06-07 MED ORDER — SODIUM CHLORIDE 0.9 % IV SOLN: INTRAVENOUS | Status: DC

## 2023-06-07 MED ORDER — PIPERACILLIN-TAZOBACTAM 3.375 G IVPB 30 MIN
3.3750 g | Freq: Four times a day (QID) | INTRAVENOUS | Status: AC
  Administered 2023-06-07: 3.375 g via INTRAVENOUS
  Filled 2023-06-07: qty 50

## 2023-06-07 MED ORDER — PROPOFOL 10 MG/ML IV BOLUS
INTRAVENOUS | Status: DC | PRN
Start: 2023-06-07 — End: 2023-06-07
  Administered 2023-06-07: 180 mg via INTRAVENOUS

## 2023-06-07 MED ORDER — ACETAMINOPHEN 10 MG/ML IV SOLN
INTRAVENOUS | Status: DC | PRN
  Administered 2023-06-07: 1000 mg via INTRAVENOUS

## 2023-06-07 MED ORDER — EPHEDRINE 5 MG/ML INJ
INTRAVENOUS | Status: AC
  Filled 2023-06-07: qty 5

## 2023-06-07 MED ORDER — BUPIVACAINE-EPINEPHRINE 0.25% -1:200000 IJ SOLN
INTRAMUSCULAR | Status: DC | PRN
  Administered 2023-06-07: 30 mL

## 2023-06-07 SURGICAL SUPPLY — 40 items
ADH SKN CLS APL DERMABOND .7 (GAUZE/BANDAGES/DRESSINGS) ×1
BLADE CLIPPER SURG (BLADE) ×1 IMPLANT
CUTTER FLEX LINEAR 45M (STAPLE) ×1 IMPLANT
DERMABOND ADVANCED .7 DNX12 (GAUZE/BANDAGES/DRESSINGS) ×1 IMPLANT
ELECT REM PT RETURN 9FT ADLT (ELECTROSURGICAL) ×1
ELECTRODE REM PT RTRN 9FT ADLT (ELECTROSURGICAL) ×1 IMPLANT
GLOVE ORTHO TXT STRL SZ7.5 (GLOVE) ×1 IMPLANT
GOWN STRL REUS W/ TWL LRG LVL3 (GOWN DISPOSABLE) ×1 IMPLANT
GOWN STRL REUS W/ TWL XL LVL3 (GOWN DISPOSABLE) ×1 IMPLANT
GOWN STRL REUS W/TWL LRG LVL3 (GOWN DISPOSABLE) ×2
GOWN STRL REUS W/TWL XL LVL3 (GOWN DISPOSABLE) ×1
GRASPER SUT TROCAR 14GX15 (MISCELLANEOUS) IMPLANT
IRRIGATION STRYKERFLOW (MISCELLANEOUS) IMPLANT
IRRIGATOR STRYKERFLOW (MISCELLANEOUS)
IV NS IRRIG 3000ML ARTHROMATIC (IV SOLUTION) IMPLANT
KIT TURNOVER KIT A (KITS) ×1 IMPLANT
MANIFOLD NEPTUNE II (INSTRUMENTS) ×1 IMPLANT
NDL HYPO 22X1.5 SAFETY MO (MISCELLANEOUS) ×1 IMPLANT
NDL INSUFFLATION 14GA 120MM (NEEDLE) IMPLANT
NEEDLE HYPO 22X1.5 SAFETY MO (MISCELLANEOUS) ×1 IMPLANT
NEEDLE INSUFFLATION 14GA 120MM (NEEDLE) IMPLANT
NS IRRIG 500ML POUR BTL (IV SOLUTION) ×1 IMPLANT
PACK LAP CHOLECYSTECTOMY (MISCELLANEOUS) ×1 IMPLANT
RELOAD 45 VASCULAR/THIN (ENDOMECHANICALS) ×1 IMPLANT
RELOAD STAPLE 45 2.5 WHT GRN (ENDOMECHANICALS) ×1 IMPLANT
SET TUBE SMOKE EVAC HIGH FLOW (TUBING) ×1 IMPLANT
SHEARS HARMONIC ACE PLUS 36CM (ENDOMECHANICALS) ×1 IMPLANT
SLEEVE Z-THREAD 5X100MM (TROCAR) ×1 IMPLANT
SPIKE FLUID TRANSFER (MISCELLANEOUS) ×1 IMPLANT
SUT MNCRL 4-0 (SUTURE) ×2
SUT MNCRL 4-0 27XMFL (SUTURE) ×2
SUT VICRYL 0 UR6 27IN ABS (SUTURE) IMPLANT
SUTURE MNCRL 4-0 27XMF (SUTURE) ×1 IMPLANT
SYS BAG RETRIEVAL 10MM (BASKET) ×1
SYSTEM BAG RETRIEVAL 10MM (BASKET) ×1 IMPLANT
TRAP FLUID SMOKE EVACUATOR (MISCELLANEOUS) ×1 IMPLANT
TRAY FOLEY MTR SLVR 16FR STAT (SET/KITS/TRAYS/PACK) ×1 IMPLANT
TROCAR ADV FIXATION 12X100MM (TROCAR) ×1 IMPLANT
TROCAR Z-THREAD FIOS 5X100MM (TROCAR) ×1 IMPLANT
WATER STERILE IRR 500ML POUR (IV SOLUTION) ×1 IMPLANT

## 2023-06-07 NOTE — Anesthesia Procedure Notes (Signed)
Procedure Name: Intubation Date/Time: 06/07/2023 12:41 PM  Performed by: Jeannene Patella, CRNAPre-anesthesia Checklist: Patient identified, Timeout performed, Emergency Drugs available, Suction available and Patient being monitored Patient Re-evaluated:Patient Re-evaluated prior to induction Oxygen Delivery Method: Circle system utilized Preoxygenation: Pre-oxygenation with 100% oxygen Induction Type: IV induction Ventilation: Mask ventilation without difficulty Laryngoscope Size: McGraph and 3 Grade View: Grade I Tube type: Oral Tube size: 6.0 mm Number of attempts: 1 Airway Equipment and Method: Stylet, LTA kit utilized and Video-laryngoscopy Placement Confirmation: ETT inserted through vocal cords under direct vision, positive ETCO2 and breath sounds checked- equal and bilateral Secured at: 20 cm Tube secured with: Tape Dental Injury: Teeth and Oropharynx as per pre-operative assessment

## 2023-06-07 NOTE — ED Notes (Signed)
Pt ambulatory to restroom without difficulty. Instructed to change into gown when returning to room.

## 2023-06-07 NOTE — Discharge Instructions (Signed)

## 2023-06-07 NOTE — H&P (Signed)
Patient ID: Robin Mccall, female   DOB: 16-Sep-1996, 27 y.o.   MRN: [id number]  Chief Complaint: Abdominal pain  History of Present Illness DANIEAL HAUENSTEIN is a 27 y.o. female with acute onset prior to supper last evening of mid to left-sided abdominal pain.  Ate dinner attempted to sleep, unable to sleep she presented to the ED.  Denies nausea/vomiting, denies fevers/chills.  No prior similar episodes of pain.  Pain is progressed to severe, nothing she wants to ever experience again.  No diarrhea, no change in bowel habits.  Denies dysuria, does note some lingering/chronic lower back pain. CT scan obtained in ED consistent with early acute appendicitis with appendicoliths.  White blood cell count 11 K.  Urine pregnancy negative, urinalysis negative.  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   Reflux    Syncope    Vitamin D deficiency    Wrist pain       Past Surgical History:  Procedure Laterality Date   DIAGNOSTIC LAPAROSCOPY     GANGLION CYST EXCISION Left 12/14/2015   Procedure: REMOVAL GANGLION OF WRIST;  Surgeon: Deeann Saint, MD;  Location: [facility] ORS;  Service: Orthopedics;  Laterality: Left;   laporoscopic abdomen      No Known Allergies  Current Facility-Administered Medications  Medication Dose Route Frequency Provider Last Rate Last Admin   0.9 %  sodium chloride infusion   Intravenous Continuous Campbell Lerner, MD       HYDROmorphone (DILAUDID) injection 0.5 mg  0.5 mg Intravenous Q2H PRN Campbell Lerner, MD       ondansetron [facility]) injection 4 mg  4 mg Intravenous Q6H PRN Campbell Lerner, MD       piperacillin-tazobactam (ZOSYN) IVPB 3.375 g   3.375 g Intravenous Q6H Campbell Lerner, MD       Current Outpatient Medications  Medication Sig Dispense Refill   TRI-SPRINTEC 0.18/0.215/0.25 MG-35 MCG tablet Take 1 tablet by mouth daily.     methocarbamol (ROBAXIN) 750 MG tablet Take 1,500 mg by mouth 4 (four) times daily. (Patient not taking: Reported on 06/07/2023)      Family History Family History  Problem Relation Age of Onset   Stroke Maternal Aunt    Bone cancer Maternal Grandfather    Stomach cancer Maternal Great-grandfather    Kidney disease Neg Hx    Bladder Cancer Neg Hx       Social History Social History   Tobacco Use   Smoking status: Never   Smokeless tobacco: Never  Vaping Use   Vaping status: Never Used  Substance Use Topics   Alcohol use: Not Currently   Drug use: No        Review of Systems  All other systems reviewed and are negative.    Physical Exam Blood pressure 108/62, pulse 68, temperature 98 F (36.7 C), temperature source Oral, resp. rate 18, height 5\' 3"  (1.6 m), weight 68 kg, last menstrual period 05/14/2023, SpO2 98%. Last Weight  Most recent update: 06/07/2023  4:49 AM    Weight  68 kg (150 lb)  CONSTITUTIONAL: Well developed, and nourished, appropriately responsive and aware without distress.   EYES: Sclera non-icteric.   EARS, NOSE, MOUTH AND THROAT:  The oropharynx is clear. Oral mucosa is pink and moist.    Hearing is intact to voice.  NECK: Trachea is midline, and there is no jugular venous distension.  LYMPH NODES:  Lymph nodes in the neck are not appreciated. RESPIRATORY:  Lungs are clear, and breath sounds are equal bilaterally.  Normal respiratory effort without pathologic use of accessory muscles. CARDIOVASCULAR: Heart is regular in rate and rhythm.   Well perfused.  GI: The abdomen is soft, mildly tender and poorly localized, and nondistended. There were no palpable masses. MUSCULOSKELETAL:  Symmetrical muscle tone appreciated in all four extremities.     SKIN: Skin turgor is normal. No pathologic skin lesions appreciated.  NEUROLOGIC:  Motor and sensation appear grossly normal.  Cranial nerves are grossly without defect. PSYCH:  Alert and oriented to person, place and time. Affect is appropriate for situation.  Data Reviewed I have personally reviewed what is currently available of the patient's imaging, recent labs and medical records.   Labs:     Latest Ref Rng & Units 06/06/2023   11:43 PM 07/01/2022    9:25 PM 07/13/2018    2:16 AM  CBC  WBC 4.0 - 10.5 K/uL 11.0  12.3  7.0   Hemoglobin 12.0 - 15.0 g/dL 82.9  56.2  13.0   Hematocrit 36.0 - 46.0 % 39.8  42.2  38.9   Platelets 150 - 400 K/uL 336  294  257       Latest Ref Rng & Units 06/06/2023   11:43 PM 07/01/2022    9:25 PM 07/13/2018    2:16 AM  CMP  Glucose 70 - 99 mg/dL 865  784  696   BUN 6 - 20 mg/dL 14  19  13    Creatinine 0.44 - 1.00 mg/dL 2.95  2.84  1.32   Sodium 135 - 145 mmol/L 138  137  138   Potassium 3.5 - 5.1 mmol/L 3.5  3.8  3.8   Chloride 98 - 111 mmol/L 106  107  108   CO2 22 - 32 mmol/L 24  26  25    Calcium 8.9 - 10.3 mg/dL 8.7  8.6  9.1   Total Protein 6.5 - 8.1 g/dL 6.2   6.7   Total Bilirubin 0.3 - 1.2 mg/dL 0.6   0.6   Alkaline Phos 38 - 126 U/L 48   50   AST 15 - 41 U/L 16   24   ALT 0 - 44 U/L 15   24       Imaging: Radiological images reviewed:   Within last 24 hrs: CT ABDOMEN PELVIS W CONTRAST  Result Date: 06/07/2023 CLINICAL DATA:  RLQ abdominal pain EXAM: CT ABDOMEN AND PELVIS WITH CONTRAST TECHNIQUE: Multidetector CT imaging of the abdomen and pelvis was performed using the standard protocol following bolus administration of intravenous contrast. RADIATION DOSE REDUCTION: This exam was performed according to the departmental dose-optimization program which includes automated exposure control, adjustment of the mA and/or kV according to patient size and/or use of iterative reconstruction technique. CONTRAST:  OMNIPAQUE IOHEXOL 300  MG/ML  SOLN COMPARISON:  CT abdomen pelvis 07/13/2018 FINDINGS: Lower chest: No acute abnormality. Hepatobiliary: No focal liver abnormality. Contracted gallbladder. No gallstones, gallbladder wall thickening, or pericholecystic fluid. No biliary dilatation. Pancreas: No focal lesion. Normal pancreatic contour. No surrounding inflammatory changes. No main pancreatic  ductal dilatation. Spleen: Normal in size without focal abnormality. Adrenals/Urinary Tract: No adrenal nodule bilaterally. Bilateral kidneys enhance symmetrically. No hydronephrosis. No hydroureter. The urinary bladder is unremarkable. Stomach/Bowel: Stomach is within normal limits. No evidence of bowel wall thickening or dilatation. The appendix is mildly enlarged measuring up to 8 mm in caliber. Possibly developing mild Peri appendiceal fat stranding. Punctate appendicolith noted within the lumen of the appendix. Vascular/Lymphatic: No abdominal aorta or iliac aneurysm. No abdominal, pelvic, or inguinal lymphadenopathy. Reproductive: Uterus and bilateral adnexa are unremarkable. Other: No intraperitoneal free fluid. No intraperitoneal free gas. No organized fluid collection. Musculoskeletal: No abdominal wall hernia or abnormality. No suspicious lytic or blastic osseous lesions. No acute displaced fracture. Stable chronic sclerosis of the L1 vertebral body. IMPRESSION: Likely early/mild non-perforated acute appendicitis with associated punctate appendicoliths. Electronically Signed   By: Tish Frederickson M.D.   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  She accepts the risk of having a normal appendix removed, desiring to  proceed with surgery. Plan is to proceed with laparoscopic appendectomy, we discussed the pros and cons of robotic assisted.  The risks, benefits, complications, treatment options, and expected outcomes were discussed with the patient. The treatment of antibiotics alone was discussed giving a 20% chance that this could fail and surgery would be necessary.  Also discussed continuing to the operating room for Laparoscopic Appendectomy.  The possibilities of  bleeding, recurrent infection, perforation of viscus, finding a normal appendix, the need for additional procedures, failure to diagnose a condition, conversion to open procedure and creating a complication requiring transfusion or further operations were discussed. The patient was given the opportunity to ask questions and have them answered.  Patient would like to proceed with Laparoscopic Appendectomy and consent was obtained.     Latest Ref Rng & Units 06/06/2023   11:43 PM 07/01/2022    9:25 PM 07/13/2018    2:16 AM  CBC  WBC 4.0 - 10.5 K/uL 11.0  12.3  7.0   Hemoglobin 12.0 - 15.0 g/dL 81.1  91.4  78.2   Hematocrit 36.0 - 46.0 % 39.8  42.2  38.9   Platelets 150 - 400 K/uL 336  294  257     Face-to-face time spent with the patient and accompanying care providers(if present) was 45 minutes, with more than 50% of the time spent counseling, educating, and coordinating care of the patient.    These notes generated with voice recognition software. I apologize for typographical errors.  Campbell Lerner M.D., FACS 06/07/2023, 6:06 AM

## 2023-06-07 NOTE — Interval H&P Note (Signed)
History and Physical Interval Note:  06/07/2023 12:41 PM  Robin Mccall  has presented today for surgery, with the diagnosis of Acute appendicitis.  The various methods of treatment have been discussed with the patient and family. After consideration of risks, benefits and other options for treatment, the patient has consented to  Procedure(s): APPENDECTOMY LAPAROSCOPIC (N/A) as a surgical intervention.  The patient's history has been reviewed, patient examined, no change in status, stable for surgery.  I have reviewed the patient's chart and labs.  Questions were answered to the patient's satisfaction.     Campbell Lerner

## 2023-06-07 NOTE — Op Note (Signed)
Laparascopic appendectomy   Robin Mccall Date of operation:  06/07/2023  Indications: The patient presented with a history of  abdominal pain. Workup has revealed findings consistent with acute appendicitis.  Pre-operative Diagnosis: Acute appendicitis without mention of peritonitis  Post-operative Diagnosis: Acute appendicitis without mention of peritonitis  Surgeon: Campbell Lerner, M.D., FACS  Anesthesia: General with endotracheal tube  Findings: c/w early acute appendicitis.   Estimated Blood Loss: 5 mL         Specimens: appendix         Complications:  None  Procedure Details  The patient was seen again in the preop area. The options of surgery versus observation were reviewed with the patient and/or family. The risks of bleeding, infection, recurrence of symptoms, negative laparoscopy, potential for an open procedure, bowel injury, abscess or infection, were all reviewed as well. The patient was taken to Operating Room, identified as [name] and the procedure verified as laparoscopic appendectomy. A Time Out was held and the above information confirmed. The patient was placed in the supine position and general anesthesia was induced.  Antibiotic prophylaxis was administered pre-op and VT E prophylaxis was in place.  After local infiltration of quarter percent Marcaine with epinephrine, stab incision was made left upper quadrant.  Just below the costal margin approximately midclavicular line the Veress needle is passed with sensation of the layers to penetrate the abdominal wall and into the peritoneum.  Saline drop test is confirmed peritoneal placement.  Insufflation is initiated with carbon dioxide to pressures of 15 mmHg.  The abdomen was prepped and draped in a sterile fashion. Local infiltration with 0.25% Marcaine with epi is administered to all incisions.  An infraumbilical incision was made.  And a 5 mm optical trocar was passed into the peritoneal cavity under direct  visualization.  Pneumoperitoneum obtained. One 12 mm port in the LLQ, and another 5 mm port were placed under direct visualization.  The appendix was identified.  The appendix was carefully dissected. The mesoappendix was divided with Harmonic scalpel. The base of the appendix was dissected out and divided with a 45 mm white load Endo GIA.The appendix was placed in a Endo Catch bag and removed via the 12 mm LLQ port. The right lower quadrant and pelvis was then evaluated to ensure adequate hemostasis, a trace of free peritoneal fluid/blood noted which was then aspirated. Inspection  failed to identify any additional bleeding and there were no signs of bowel injury. Again the right lower quadrant was inspected there was no sign of bleeding or bowel injury. The LLQ fascia was closed with 0 Vicryl using the suture passer, pneumoperitoneum was released, all ports were removed, and the skin incisions were approximated with subcuticular 4-0 Monocryl. Dermabond was applied.  The patient tolerated the procedure well, there were no complications. The sponge lap and needle count were correct at the end of the procedure.  The patient was taken to the recovery room in stable condition to be discharged to home, probably in the morning, when appropriate.   Campbell Lerner, M.D., [facility] 06/07/2023 - 1:41 PM

## 2023-06-07 NOTE — Transfer of Care (Signed)
Immediate Anesthesia Transfer of Care Note  Patient: Robin Mccall  Procedure(s) Performed: APPENDECTOMY LAPAROSCOPIC  Patient Location: PACU  Anesthesia Type:General  Level of Consciousness: oriented, drowsy, and patient cooperative  Airway & Oxygen Therapy: Patient Spontanous Breathing  Post-op Assessment: Report given to RN and Post -op Vital signs reviewed and stable  Post vital signs: Reviewed and stable  Last Vitals:  Vitals Value Taken Time  BP 123/66 06/07/23 1343  Temp    Pulse 113 06/07/23 1345  Resp 14 06/07/23 1345  SpO2 99 % 06/07/23 1345  Vitals shown include unfiled device data.  Last Pain:  Vitals:   06/07/23 1216  TempSrc:   PainSc: 0-No pain      Patients Stated Pain Goal: 0 (06/07/23 1216)  Complications: No notable events documented.

## 2023-06-07 NOTE — Anesthesia Preprocedure Evaluation (Signed)
Anesthesia Evaluation  Patient identified by MRN, date of birth, ID band Patient awake    Reviewed: Allergy & Precautions, NPO status , Patient's Chart, lab work & pertinent test results  Airway Mallampati: I  TM Distance: >3 FB Neck ROM: full    Dental  (+) Chipped   Pulmonary asthma    Pulmonary exam normal        Cardiovascular negative cardio ROS Normal cardiovascular exam     Neuro/Psych negative neurological ROS  negative psych ROS   GI/Hepatic negative GI ROS, Neg liver ROS,,,  Endo/Other  negative endocrine ROS    Renal/GU      Musculoskeletal   Abdominal   Peds  Hematology negative hematology ROS (+)   Anesthesia Other Findings Past Medical History: No date: Abdominal pain No date: Allergic rhinitis No date: Asthma No date: Dermatitis No date: Dysmenorrhea No date: Glucose found in urine on examination     Comment:  seeing nephrologist on 12-07-15-pt unsure of what the               MD's name is  No date: HA (headache)     Comment:  migraines-related to stress No date: HLD (hyperlipidemia) No date: Hydronephrosis 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 No date: Reflux No date: Syncope No date: Vitamin D deficiency No date: Wrist pain  Past Surgical History: No date: DIAGNOSTIC LAPAROSCOPY 12/14/2015: GANGLION CYST EXCISION; Left     Comment:  Procedure: REMOVAL GANGLION OF WRIST;  Surgeon: Deeann Saint, MD;  Location: [facility] ORS;  Service: Orthopedics;                Laterality: Left; No date: laporoscopic abdomen  BMI    Body Mass Index: 26.57 kg/m      Reproductive/Obstetrics negative OB ROS                             Anesthesia Physical Anesthesia Plan  ASA: 2  Anesthesia Plan: General ETT   Post-op Pain Management:    Induction: Intravenous  PONV Risk Score and Plan: 3 and Ondansetron, Dexamethasone and Midazolam  Airway Management Planned: Oral  ETT  Additional Equipment:   Intra-op Plan:   Post-operative Plan: Extubation in OR  Informed Consent: I have reviewed the patients History and Physical, chart, labs and discussed the procedure including the risks, benefits and alternatives for the proposed anesthesia with the patient or authorized representative who has indicated his/her understanding and acceptance.     Dental Advisory Given  Plan Discussed with: Anesthesiologist, CRNA and Surgeon  Anesthesia Plan Comments: (Patient consented for risks of anesthesia including but not limited to:  - adverse reactions to medications - damage to eyes, teeth, lips or other oral mucosa - nerve damage due to positioning  - sore throat or hoarseness - Damage to heart, brain, nerves, lungs, other parts of body or loss of life  Patient voiced understanding.)       Anesthesia Quick Evaluation

## 2023-06-07 NOTE — ED Notes (Signed)
Pt to be transported to OR by OR tech at this time. Stable at time of departure.

## 2023-06-07 NOTE — ED Provider Notes (Signed)
[facility] Provider Note    Event Date/Time   First MD Initiated Contact with Patient 06/07/23 0032     (approximate)   History   Abdominal Pain   HPI Robin Mccall is a 27 y.o. female who is otherwise healthy and presents for evaluation of abdominal pain.  She said that it started a few hours ago and has been getting a little bit worse.  It feels like both a sharp and aching pain and is somewhere around her bellybutton.  She has vomited twice.  The onset was relatively acute and she has been otherwise feeling okay today without fever, chest pain, shortness of breath, nor dysuria.     Physical Exam   Triage Vital Signs: ED Triage Vitals  Encounter Vitals Group     BP 06/06/23 2342 119/68     Systolic BP Percentile --      Diastolic BP Percentile --      Pulse Rate 06/06/23 2342 94     Resp 06/06/23 2342 20     Temp 06/06/23 2342 98.2 F (36.8 C)     Temp Source 06/06/23 2342 Oral     SpO2 06/06/23 2342 99 %     Weight 06/06/23 2331 68 kg (150 lb)     Height 06/06/23 2331 1.6 m (5\' 3" )     Head Circumference --      Peak Flow --      Pain Score 06/06/23 2331 9     Pain Loc --      Pain Education --      Exclude from Growth Chart --     Most recent vital signs: Vitals:   06/06/23 2342  BP: 119/68  Pulse: 94  Resp: 20  Temp: 98.2 F (36.8 C)  SpO2: 99%    General: Awake, no distress.  CV:  Good peripheral perfusion.  Resp:  Normal effort. Speaking easily and comfortably, no accessory muscle usage nor intercostal retractions.   Abd:  No distention.  Soft.  Moderate localized tenderness to palpation around the umbilicus, otherwise no rebound or guarding.   ED Results / Procedures / Treatments   Labs (all labs ordered are listed, but only abnormal results are displayed) Labs Reviewed  CBC WITH DIFFERENTIAL/PLATELET - Abnormal; Notable for the following components:      Result Value   WBC 11.0 (*)    All other components within  normal limits  COMPREHENSIVE METABOLIC PANEL - Abnormal; Notable for the following components:   Glucose, Bld 124 (*)    Calcium 8.7 (*)    Total Protein 6.2 (*)    All other components within normal limits  URINALYSIS, ROUTINE W REFLEX MICROSCOPIC - Abnormal; Notable for the following components:   Color, Urine YELLOW (*)    APPearance CLEAR (*)    Hgb urine dipstick SMALL (*)    All other components within normal limits  LIPASE, BLOOD  POC URINE PREG, ED     RADIOLOGY I viewed and interpreted the patient's CT scan of the abdomen and pelvis.  See hospital course for details.   PROCEDURES:  Critical Care performed: No  Procedures    IMPRESSION / MDM / ASSESSMENT AND PLAN / ED COURSE  I reviewed the triage vital signs and the nursing notes.                              Differential diagnosis includes, but is  not limited to, appendicitis, constipation, epiploic appendagitis, STD/PID/TOA  Patient's presentation is most consistent with acute presentation with potential threat to life or bodily function.  Labs/studies ordered: CBC with differential, CMP, lipase, urinalysis, urine pregnancy test, CT of the abdomen and pelvis  Interventions/Medications given:  Medications  piperacillin-tazobactam (ZOSYN) IVPB 3.375 g (has no administration in time range)  sodium chloride 0.9 % bolus 1,000 mL (has no administration in time range)  ketorolac (TORADOL) 30 MG/ML injection 15 mg (15 mg Intravenous Given 06/07/23 0216)  iohexol (OMNIPAQUE) 300 MG/ML solution 100 mL (100 mLs Intravenous Contrast Given 06/07/23 0226)    (Note:  hospital course my include additional interventions and/or labs/studies not listed above.)   Patient's vital signs are stable and essentially normal.  Laboratory evaluation is also essentially normal other than a mild leukocytosis of 11.  Given that the patient has some relatively mild localized tenderness (peritonitis) in the umbilical region and the  symptoms are relatively early in onset, I will evaluate with a CT of the abdomen and pelvis.  Her last menstrual period was several weeks ago and I think it is unlikely based on the location that this is a GYN issue, but CT scan will help assess for any acute abnormalities.  Patient agrees with the plan.  Toradol 15 mg IV for mild discomfort.     Clinical Course as of 06/07/23 0313  Wed Jun 07, 2023  0308 CT ABDOMEN PELVIS W CONTRAST I viewed and interpreted the patient's CT of the abdomen and pelvis and she seems to have some inflammatory changes around the appendix.  The radiologist confirmed this is likely acute appendicitis.  I updated the patient.  She remains generally comfortable.  Last had anything to eat or drink about 7 hours ago.  I consulted Dr. Claudine Mouton by phone and he will admit the patient and see her when he has the opportunity (he has a critical patient in the ICU that he will need to take shortly to the operating room).  Patient is aware of the plan and agrees. [CF]    Clinical Course User Index [CF] Loleta Rose, MD     FINAL CLINICAL IMPRESSION(S) / ED DIAGNOSES   Final diagnoses:  Acute appendicitis with localized peritonitis, without perforation, abscess, or gangrene     Rx / DC Orders   ED Discharge Orders     None        Note:  This document was prepared using Dragon voice recognition software and may include unintentional dictation errors.   Loleta Rose, MD 06/07/23 [phone]

## 2023-06-08 NOTE — Anesthesia Postprocedure Evaluation (Signed)
Anesthesia Post Note  Patient: Robin Mccall  Procedure(s) Performed: APPENDECTOMY LAPAROSCOPIC  Patient location during evaluation: PACU Anesthesia Type: General Level of consciousness: awake and alert Pain management: pain level controlled Vital Signs Assessment: post-procedure vital signs reviewed and stable Respiratory status: spontaneous breathing, nonlabored ventilation, respiratory function stable and patient connected to nasal cannula oxygen Cardiovascular status: blood pressure returned to baseline and stable Postop Assessment: no apparent nausea or vomiting Anesthetic complications: no   No notable events documented.   Last Vitals:  Vitals:   06/07/23 1428 06/07/23 1441  BP: (!) 108/52 111/62  Pulse: 82 94  Resp: 17 18  Temp: 36.6 C 36.5 C  SpO2: 100% 99%    Last Pain:  Vitals:   06/07/23 1441  TempSrc: Oral  PainSc: 2                  Stephanie Coup

## 2023-06-19 ENCOUNTER — Other Ambulatory Visit (HOSPITAL_COMMUNITY)
Admission: RE | Admit: 2023-06-19 | Discharge: 2023-06-19 | Disposition: A | Discharge status: Home / Self Care | Payer: Medicaid Other | Source: Ambulatory Visit | Attending: Obstetrics and Gynecology | Admitting: Obstetrics and Gynecology

## 2023-06-19 ENCOUNTER — Ambulatory Visit (INDEPENDENT_AMBULATORY_CARE_PROVIDER_SITE_OTHER): Payer: Medicaid Other

## 2023-06-19 VITALS — BP 114/71 | HR 67 | Ht 63.0 in | Wt 144.2 lb

## 2023-06-19 DIAGNOSIS — N898 Other specified noninflammatory disorders of vagina: Secondary | ICD-10-CM | POA: Insufficient documentation

## 2023-06-19 DIAGNOSIS — Z113 Encounter for screening for infections with a predominantly sexual mode of transmission: Secondary | ICD-10-CM | POA: Insufficient documentation

## 2023-06-19 NOTE — Progress Notes (Signed)
    NURSE VISIT NOTE  Subjective:    Patient ID: Robin Mccall, female    DOB: 17-Nov-1995, 27 y.o.   MRN: [id number]  HPI  Patient is a 27 y.o. G0P0000 female who presents for white vaginal discharge for 5 day(s). Denies abnormal vaginal bleeding or significant pelvic pain or fever. denies UTI symptoms. Patient denies history of known exposure to STD.   Objective:    BP 114/71   Pulse 67   Ht 5\' 3"  (1.6 m)   Wt 144 lb 3.2 oz (65.4 kg)   LMP 05/14/2023 (Exact Date)   BMI 25.54 kg/m      Assessment:   1. Vaginal discharge   2. Vaginal irritation   3. Vaginal odor   4. Screen for STD (sexually transmitted disease)       Plan:   GC and chlamydia DNA  probe sent to lab. Treatment: await lab results for further evaluation ROV prn if symptoms persist or worsen.   Loman Chroman, CMA

## 2023-06-20 ENCOUNTER — Ambulatory Visit (INDEPENDENT_AMBULATORY_CARE_PROVIDER_SITE_OTHER): Payer: Medicaid Other | Admitting: Physician Assistant

## 2023-06-20 ENCOUNTER — Encounter: Payer: Self-pay | Admitting: Physician Assistant

## 2023-06-20 VITALS — BP 118/73 | HR 96 | Temp 98.5°F | Ht 63.0 in | Wt 144.4 lb

## 2023-06-20 DIAGNOSIS — K353 Acute appendicitis with localized peritonitis, without perforation or gangrene: Secondary | ICD-10-CM

## 2023-06-20 DIAGNOSIS — K3580 Unspecified acute appendicitis: Secondary | ICD-10-CM

## 2023-06-20 DIAGNOSIS — Z09 Encounter for follow-up examination after completed treatment for conditions other than malignant neoplasm: Secondary | ICD-10-CM

## 2023-06-20 NOTE — Patient Instructions (Signed)

## 2023-06-20 NOTE — Progress Notes (Signed)
[facility] POST-OP OFFICE VISIT  06/20/2023  HPI: Robin Mccall is a 27 y.o. female 13 days s/p laparoscopic appendectomy for acute appendicitis  She is overall doing well Minimal incisional soreness at this point No fever, chills, nausea, emesis Constipation - baseline for her, utilizes Miralax as needed Incisions well healed No other complaints   Vital signs: BP 118/73   Pulse 96   Temp 98.5 F (36.9 C) (Oral)   Ht 5\' 3"  (1.6 m)   Wt 144 lb 6.4 oz (65.5 kg)   LMP 05/14/2023 (Exact Date)   SpO2 97%   BMI 25.58 kg/m    Physical Exam: Constitutional: Well appearing female, NAD Abdomen: Soft, non-tender, non-distended, no rebound/guarding Skin: Laparoscopic incisions are healing well, no erythema or drainage   Assessment/Plan: This is a 27 y.o. female 13 days s/p laparoscopic appendectomy for acute appendicitis   - Pain control prn  - Reviewed wound care recommendation  - Reviewed lifting restrictions; 4 weeks total - Work note given   - Reviewed surgical pathology; Appendicitis  - She can follow up on as needed basis; She understands to call with questions/concerns  -- Lynden Oxford, PA-C [facility] 06/20/2023, 3:09 PM M-F: 7am - 4pm

## 2023-06-21 ENCOUNTER — Other Ambulatory Visit: Payer: Self-pay

## 2023-06-21 DIAGNOSIS — B9689 Other specified bacterial agents as the cause of diseases classified elsewhere: Secondary | ICD-10-CM

## 2023-06-21 MED ORDER — METRONIDAZOLE 500 MG PO TABS
500.0000 mg | ORAL_TABLET | Freq: Two times a day (BID) | ORAL | 0 refills | Status: DC
Start: 2023-06-21 — End: 2023-07-27

## 2023-06-26 ENCOUNTER — Telehealth: Payer: Self-pay | Admitting: *Deleted

## 2023-06-26 NOTE — Telephone Encounter (Signed)
Faxed FMLA to [employer] at [phone]

## 2023-07-13 ENCOUNTER — Ambulatory Visit: Payer: Medicaid Other

## 2023-07-27 ENCOUNTER — Encounter: Payer: Self-pay | Admitting: Obstetrics and Gynecology

## 2023-07-27 ENCOUNTER — Ambulatory Visit: Payer: Medicaid Other | Admitting: Obstetrics and Gynecology

## 2023-07-27 VITALS — BP 104/68 | Ht 63.0 in | Wt 146.0 lb

## 2023-07-27 DIAGNOSIS — N941 Unspecified dyspareunia: Secondary | ICD-10-CM

## 2023-07-27 DIAGNOSIS — Z3041 Encounter for surveillance of contraceptive pills: Secondary | ICD-10-CM

## 2023-07-27 DIAGNOSIS — N921 Excessive and frequent menstruation with irregular cycle: Secondary | ICD-10-CM

## 2023-07-27 DIAGNOSIS — Z3202 Encounter for pregnancy test, result negative: Secondary | ICD-10-CM | POA: Diagnosis not present

## 2023-07-27 LAB — POCT WET PREP WITH KOH
Clue Cells Wet Prep HPF POC: NEGATIVE
KOH Prep POC: NEGATIVE
Trichomonas, UA: NEGATIVE
Yeast Wet Prep HPF POC: NEGATIVE

## 2023-07-27 LAB — POCT URINE PREGNANCY: Preg Test, Ur: NEGATIVE

## 2023-07-27 MED ORDER — NORGESTIM-ETH ESTRAD TRIPHASIC 0.18/0.215/0.25 MG-35 MCG PO TABS
1.0000 | ORAL_TABLET | Freq: Every day | ORAL | 0 refills | Status: DC
Start: 2023-07-27 — End: 2023-09-25

## 2023-07-27 NOTE — Progress Notes (Signed)
Evelene Croon, MD   Chief Complaint  Patient presents with   Vaginal Pain    During intercourse, irregular cycles/spotting. Noticed all sx since appendectomy surgery 05/2023    HPI:      [name] is a 27 y.o. G0P0000 whose LMP was No LMP recorded., presents today for vaginal pain during sex since 7/24 appendectomy. No bleeding but notices burning afterwards with urination mainly and some during sex. Not using lubricants but thinks there could be a little dryness. No pelvic pain with sex. No increased vag d/c, odor. Treated for BV 8/24 and then yeast vag after flagyl abx. Those sx resolved.  Menses usually monthly, lasting 3-4 days on and off OCPs; stopped OCPs a few months ago (before appendectomy). Resumed them mid cycle 4 wks ago after neg UPT. Had period last wk and now spotting and cramping. Needs Rx RF. No late/missed pills.  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 Due for LEEP but not done. 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    Past Surgical History:  Procedure Laterality Date   DIAGNOSTIC LAPAROSCOPY     GANGLION CYST EXCISION Left 12/14/2015   Procedure: REMOVAL GANGLION OF WRIST;  Surgeon: Deeann Saint, MD;  Location: [facility] ORS;  Service: Orthopedics;  Laterality: Left;   LAPAROSCOPIC APPENDECTOMY N/A 06/07/2023   Procedure: APPENDECTOMY LAPAROSCOPIC;  Surgeon: Campbell Lerner, MD;  Location: [facility] ORS;  Service: General;  Laterality: N/A;   laporoscopic abdomen      Family History  Problem Relation Age of Onset   Stroke Maternal Aunt    Bone cancer Maternal Grandfather    Stomach cancer Maternal Great-grandfather     Kidney disease Neg Hx    Bladder Cancer Neg Hx     Social History   Socioeconomic History   Marital status: Single    Spouse name: Not on file   Number of children: Not on file   Years of education: Not on file   Highest education level: Not on file  Occupational History   Not on file  Tobacco Use   Smoking status: Never   Smokeless tobacco: Never  Vaping Use   Vaping status: Never Used  Substance and Sexual Activity   Alcohol use: Yes    Comment: soc   Drug use: No   Sexual activity: Yes    Birth control/protection: Pill  Other Topics Concern   Not on file  Social History Narrative   Not on file   Social Determinants of Health   Financial Resource Strain: Not on file  Food Insecurity: Not on file  Transportation Needs: Not on file  Physical Activity: Not on file  Stress: Not on file  Social Connections: Not on file  Intimate Partner Violence: Not on file    Outpatient Medications Prior to Visit  Medication Sig Dispense Refill   Norgestimate-Ethinyl Estradiol Triphasic 0.18/0.215/0.25 MG-35 MCG tablet Take 1 tablet by mouth daily.     ibuprofen (ADVIL) 800 MG tablet Take 1 tablet (800 mg total) by mouth every 8 (eight) hours as needed. 30 tablet 0   metroNIDAZOLE (FLAGYL) 500 MG tablet Take 1 tablet (500 mg total) by mouth 2 (two)  times daily. 14 tablet 0   No facility-administered medications prior to visit.      ROS:  Review of Systems  Constitutional:  Negative for fever.  Gastrointestinal:  Negative for blood in stool, constipation, diarrhea, nausea and vomiting.  Genitourinary:  Positive for dyspareunia. Negative for dysuria, flank pain, frequency, hematuria, urgency, vaginal bleeding, vaginal discharge and vaginal pain.  Musculoskeletal:  Negative for back pain.  Skin:  Negative for rash.   BREAST: No symptoms   OBJECTIVE:   Vitals:  BP 104/68   Ht 5\' 3"  (1.6 m)   Wt 146 lb (66.2 kg)   BMI 25.86 kg/m   Physical Exam Vitals reviewed.   Constitutional:      Appearance: She is well-developed.  Pulmonary:     Effort: Pulmonary effort is normal.  Genitourinary:    General: Normal vulva.     Pubic Area: No rash.      Labia:        Right: No rash, tenderness or lesion.        Left: No rash, tenderness or lesion.      Vagina: Normal. No vaginal discharge, erythema or tenderness.     Cervix: Normal.     Uterus: Normal. Not enlarged and not tender.      Adnexa: Right adnexa normal and left adnexa normal.       Right: No mass or tenderness.         Left: No mass or tenderness.    Musculoskeletal:        General: Normal range of motion.     Cervical back: Normal range of motion.  Skin:    General: Skin is warm and dry.  Neurological:     General: No focal deficit present.     Mental Status: She is alert and oriented to person, place, and time.  Psychiatric:        Mood and Affect: Mood normal.        Behavior: Behavior normal.        Thought Content: Thought content normal.        Judgment: Judgment normal.     Results: Results for orders placed or performed in visit on 07/27/23 (from the past 24 hour(s))  POCT Wet Prep with KOH     Status: Normal   Collection Time: 07/27/23  4:51 PM  Result Value Ref Range   Trichomonas, UA Negative    Clue Cells Wet Prep HPF POC neg    Epithelial Wet Prep HPF POC     Yeast Wet Prep HPF POC neg    Bacteria Wet Prep HPF POC     RBC Wet Prep HPF POC     WBC Wet Prep HPF POC     KOH Prep POC Negative Negative  POCT urine pregnancy     Status: Normal   Collection Time: 07/27/23  4:51 PM  Result Value Ref Range   Preg Test, Ur Negative Negative     Assessment/Plan: Dyspareunia in female - Plan: POCT Wet Prep with KOH; neg wet prep. Neg exam except pt noticed slight burning after exam. Use lubricants/coconut oil. If sx persist, will try sample vag ERT and avoid scented soap. Pt to f/u prn.   Breakthrough bleeding on OCPs--reassurance with OCP restart. Rx RF. F/u if sx  persist.   Encounter for surveillance of contraceptive pills - Plan: Norgestimate-Ethinyl Estradiol Triphasic 0.18/0.215/0.25 MG-35 MCG tablet; Rx RF. RTO in a couple months for pap/annual.   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    Meds ordered this encounter  Medications   Norgestimate-Ethinyl Estradiol Triphasic 0.18/0.215/0.25 MG-35 MCG tablet    Sig: Take 1 tablet by mouth daily.    Dispense:  84 tablet    Refill:  0    Order Specific Question:   Supervising Provider    Answer:   Hildred Laser [[id number]]      Return in about 2 months (around 09/26/2023) for annual./pap  Zigmund Linse B. Odaliz Mcqueary, PA-C 07/27/2023 4:54 PM

## 2023-09-21 NOTE — Progress Notes (Signed)
PCP:  Evelene Croon, MD   Chief Complaint  Patient presents with   Gynecologic Exam    No concerns     HPI:      [name] is a 27 y.o. G0P0000 whose LMP was Patient's last menstrual period was 09/19/2023 (exact date)., presents today for her annual examination.  Her menses are regular every 28-30 days, lasting 3-4 days, light flow.  Dysmenorrhea mild. She does not have intermenstrual bleeding.  Sex activity: single partner, contraception - OCP (estrogen/progesterone). Dyspareunia after appendectomy 9/24 resolved. Has some pain with dryness, improved with lubricants.  Last Pap: 03/01/22 Results were: ASCUS/POS HPV DNA; 03/09/22 colpo bx with CIN 2, LEEP recommended but not done; repeat pap today Hx of STDs: HPV  There is no FH of breast cancer. There is no FH of ovarian cancer. The patient does self-breast exams. Has stable breast mass, no pain, been there for yrs per pt (I haven't felt it before). Never had imaging.   Tobacco use: The patient denies current or previous tobacco use. Alcohol use: social drinker No drug use.  Exercise: min active; hx of exercise induced asthma, no longer has albuterol rescue inhaler since PCP retired. No asthma sx otherwise.  She does get adequate calcium but not Vitamin D in her diet. Gardasil not completed.    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    Past Surgical History:  Procedure Laterality Date   DIAGNOSTIC LAPAROSCOPY     GANGLION CYST EXCISION Left 12/14/2015   Procedure: REMOVAL GANGLION OF WRIST;  Surgeon: Deeann Saint, MD;  Location: [facility];  Service: Orthopedics;  Laterality: Left;   LAPAROSCOPIC APPENDECTOMY N/A 06/07/2023   Procedure: APPENDECTOMY  LAPAROSCOPIC;  Surgeon: Campbell Lerner, MD;  Location: [facility];  Service: General;  Laterality: N/A;   laporoscopic abdomen      Family History  Problem Relation Age of Onset   Stroke Maternal Aunt    Bone cancer Maternal Grandfather    Stomach cancer Maternal Great-grandfather    Kidney disease Neg Hx    Bladder Cancer Neg Hx     Social History   Socioeconomic History   Marital status: Single    Spouse name: Not on file   Number of children: Not on file   Years of education: Not on file   Highest education level: Not on file  Occupational History   Not on file  Tobacco Use   Smoking status: Never   Smokeless tobacco: Never  Vaping Use   Vaping status: Never Used  Substance and Sexual Activity   Alcohol use: Yes    Comment: soc   Drug use: No   Sexual activity: Yes    Birth control/protection: Pill  Other Topics Concern   Not on file  Social History Narrative   Not on file   Social Determinants of Health   Financial Resource Strain: Not on file  Food Insecurity: Not on file  Transportation Needs: Not on file  Physical Activity: Not on file  Stress: Not on file  Social Connections: Not on file  Intimate Partner Violence: Not on file     Current Outpatient Medications:    albuterol (VENTOLIN HFA) 108 (90 Base) MCG/ACT inhaler, Inhale 2 puffs into the lungs every 6 (  six) hours as needed for wheezing or shortness of breath., Disp: 8 g, Rfl: 2   Norgestimate-Ethinyl Estradiol Triphasic 0.18/0.215/0.25 MG-35 MCG tablet, Take 1 tablet by mouth daily., Disp: 84 tablet, Rfl: 3     ROS:  Review of Systems  Constitutional:  Negative for fatigue, fever and unexpected weight change.  Respiratory:  Negative for cough, shortness of breath and wheezing.   Cardiovascular:  Negative for chest pain, palpitations and leg swelling.  Gastrointestinal:  Negative for blood in stool, constipation, diarrhea, nausea and vomiting.  Endocrine: Negative for cold intolerance,  heat intolerance and polyuria.  Genitourinary:  Negative for dyspareunia, dysuria, flank pain, frequency, genital sores, hematuria, menstrual problem, pelvic pain, urgency, vaginal bleeding, vaginal discharge and vaginal pain.  Musculoskeletal:  Negative for back pain, joint swelling and myalgias.  Skin:  Negative for rash.  Neurological:  Negative for dizziness, syncope, light-headedness, numbness and headaches.  Hematological:  Negative for adenopathy.  Psychiatric/Behavioral:  Negative for agitation, confusion, sleep disturbance and suicidal ideas. The patient is not nervous/anxious.    BREAST: No symptoms   Objective: BP 118/74   Pulse 99   Ht 5\' 3"  (1.6 m)   Wt 148 lb (67.1 kg)   LMP 09/19/2023 (Exact Date)   BMI 26.22 kg/m    Physical Exam Constitutional:      Appearance: She is well-developed.  Genitourinary:     Vulva normal.     Right Labia: No rash, tenderness or lesions.    Left Labia: No tenderness, lesions or rash.    No vaginal discharge, erythema or tenderness.      Right Adnexa: not tender and no mass present.    Left Adnexa: not tender and no mass present.    No cervical friability or polyp.     Uterus is not enlarged or tender.  Breasts:    Right: No mass, nipple discharge, skin change or tenderness.     Left: No mass, nipple discharge, skin change or tenderness.  Neck:     Thyroid: No thyromegaly.  Cardiovascular:     Rate and Rhythm: Normal rate and regular rhythm.     Heart sounds: Normal heart sounds. No murmur heard. Pulmonary:     Effort: Pulmonary effort is normal.     Breath sounds: Normal breath sounds.  Chest:    Abdominal:     Palpations: Abdomen is soft.     Tenderness: There is no abdominal tenderness. There is no guarding or rebound.  Musculoskeletal:        General: Normal range of motion.     Cervical back: Normal range of motion.  Lymphadenopathy:     Cervical: No cervical adenopathy.  Neurological:     General: No focal  deficit present.     Mental Status: She is alert and oriented to person, place, and time.     Cranial Nerves: No cranial nerve deficit.  Skin:    General: Skin is warm and dry.  Psychiatric:        Mood and Affect: Mood normal.        Behavior: Behavior normal.        Thought Content: Thought content normal.        Judgment: Judgment normal.  Vitals reviewed.    Assessment/Plan: Encounter for annual routine gynecological examination  Cervical cancer screening - Plan: Cytology - PAP  Screening for HPV (human papillomavirus) - Plan: Cytology - PAP  CIN II (cervical intraepithelial neoplasia II) - Plan: Cytology - PAP; repeat pap  today. Will f/u with results. Gard #1 today.   Encounter for surveillance of contraceptive pills - Plan: Norgestimate-Ethinyl Estradiol Triphasic 0.18/0.215/0.25 MG-35 MCG tablet; OCP RF.  Mass of upper inner quadrant of right breast - Plan: [place] LIMITED ULTRASOUND INCLUDING AXILLA RIGHT BREAST; no change per pt, been there for yrs; check breast u/s, will f/u with results. Pt to call [place] to schedule.   Exercise-induced asthma - Plan: albuterol (VENTOLIN HFA) 108 (90 Base) MCG/ACT inhaler; Rx RF eRxd.    Meds ordered this encounter  Medications   albuterol (VENTOLIN HFA) 108 (90 Base) MCG/ACT inhaler    Sig: Inhale 2 puffs into the lungs every 6 (six) hours as needed for wheezing or shortness of breath.    Dispense:  8 g    Refill:  2    Order Specific Question:   Supervising Provider    Answer:   Hildred Laser [[id number]]   Norgestimate-Ethinyl Estradiol Triphasic 0.18/0.215/0.25 MG-35 MCG tablet    Sig: Take 1 tablet by mouth daily.    Dispense:  84 tablet    Refill:  3    Order Specific Question:   Supervising Provider    Answer:   Waymon Budge             GYN counsel adequate intake of calcium and vitamin D, diet and exercise, Gardasil--check with parents if done with peds     F/U  Return in about 1 year (around  09/24/2024).  Kimble Hitchens B. Desaree Downen, PA-C 09/25/2023 2:32 PM

## 2023-09-25 ENCOUNTER — Encounter: Payer: Self-pay | Admitting: Obstetrics and Gynecology

## 2023-09-25 ENCOUNTER — Ambulatory Visit (INDEPENDENT_AMBULATORY_CARE_PROVIDER_SITE_OTHER): Payer: Medicaid Other | Admitting: Obstetrics and Gynecology

## 2023-09-25 ENCOUNTER — Other Ambulatory Visit (HOSPITAL_COMMUNITY)
Admission: RE | Admit: 2023-09-25 | Discharge: 2023-09-25 | Disposition: A | Discharge status: Home / Self Care | Payer: Medicaid Other | Source: Ambulatory Visit | Attending: Obstetrics and Gynecology | Admitting: Obstetrics and Gynecology

## 2023-09-25 VITALS — BP 118/74 | HR 99 | Ht 63.0 in | Wt 148.0 lb

## 2023-09-25 DIAGNOSIS — Z1151 Encounter for screening for human papillomavirus (HPV): Secondary | ICD-10-CM | POA: Insufficient documentation

## 2023-09-25 DIAGNOSIS — Z23 Encounter for immunization: Secondary | ICD-10-CM | POA: Diagnosis not present

## 2023-09-25 DIAGNOSIS — N871 Moderate cervical dysplasia: Secondary | ICD-10-CM | POA: Diagnosis present

## 2023-09-25 DIAGNOSIS — Z124 Encounter for screening for malignant neoplasm of cervix: Secondary | ICD-10-CM | POA: Insufficient documentation

## 2023-09-25 DIAGNOSIS — Z01419 Encounter for gynecological examination (general) (routine) without abnormal findings: Secondary | ICD-10-CM

## 2023-09-25 DIAGNOSIS — Z3041 Encounter for surveillance of contraceptive pills: Secondary | ICD-10-CM

## 2023-09-25 DIAGNOSIS — N6312 Unspecified lump in the right breast, upper inner quadrant: Secondary | ICD-10-CM

## 2023-09-25 DIAGNOSIS — J45990 Exercise induced bronchospasm: Secondary | ICD-10-CM

## 2023-09-25 MED ORDER — NORGESTIM-ETH ESTRAD TRIPHASIC 0.18/0.215/0.25 MG-35 MCG PO TABS
1.0000 | ORAL_TABLET | Freq: Every day | ORAL | 3 refills | Status: DC
Start: 2023-09-25 — End: 2024-09-18

## 2023-09-25 MED ORDER — ALBUTEROL SULFATE HFA 108 (90 BASE) MCG/ACT IN AERS
2.0000 | INHALATION_SPRAY | Freq: Four times a day (QID) | RESPIRATORY_TRACT | 2 refills | Status: AC | PRN
Start: 2023-09-25 — End: ?

## 2023-09-25 NOTE — Addendum Note (Signed)
Addended by: Donnetta Hail on: 09/25/2023 02:42 PM   Modules accepted: Orders

## 2023-09-28 ENCOUNTER — Encounter: Payer: Self-pay | Admitting: Obstetrics and Gynecology

## 2023-09-28 LAB — CYTOLOGY - PAP
Comment: NEGATIVE
Diagnosis: UNDETERMINED — AB
High risk HPV: POSITIVE — AB

## 2023-10-10 ENCOUNTER — Ambulatory Visit
Admission: RE | Admit: 2023-10-10 | Discharge: 2023-10-10 | Disposition: A | Discharge status: Home / Self Care | Payer: Medicaid Other | Source: Ambulatory Visit | Attending: Obstetrics and Gynecology | Admitting: Obstetrics and Gynecology

## 2023-10-10 DIAGNOSIS — N6312 Unspecified lump in the right breast, upper inner quadrant: Secondary | ICD-10-CM | POA: Diagnosis present

## 2023-10-11 ENCOUNTER — Other Ambulatory Visit: Payer: Self-pay | Admitting: Obstetrics and Gynecology

## 2023-10-11 DIAGNOSIS — R928 Other abnormal and inconclusive findings on diagnostic imaging of breast: Secondary | ICD-10-CM

## 2023-10-12 ENCOUNTER — Other Ambulatory Visit (HOSPITAL_COMMUNITY)
Admission: RE | Admit: 2023-10-12 | Discharge: 2023-10-12 | Disposition: A | Discharge status: Home / Self Care | Payer: Medicaid Other | Source: Ambulatory Visit | Attending: Obstetrics and Gynecology | Admitting: Obstetrics and Gynecology

## 2023-10-12 ENCOUNTER — Encounter: Payer: Self-pay | Admitting: Obstetrics and Gynecology

## 2023-10-12 ENCOUNTER — Ambulatory Visit (INDEPENDENT_AMBULATORY_CARE_PROVIDER_SITE_OTHER): Payer: Medicaid Other | Admitting: Obstetrics and Gynecology

## 2023-10-12 VITALS — BP 113/72 | HR 83 | Ht 63.0 in | Wt 149.6 lb

## 2023-10-12 DIAGNOSIS — R87610 Atypical squamous cells of undetermined significance on cytologic smear of cervix (ASC-US): Secondary | ICD-10-CM

## 2023-10-12 DIAGNOSIS — R87810 Cervical high risk human papillomavirus (HPV) DNA test positive: Secondary | ICD-10-CM | POA: Insufficient documentation

## 2023-10-12 DIAGNOSIS — N871 Moderate cervical dysplasia: Secondary | ICD-10-CM | POA: Diagnosis present

## 2023-10-12 DIAGNOSIS — Z3202 Encounter for pregnancy test, result negative: Secondary | ICD-10-CM

## 2023-10-12 DIAGNOSIS — N870 Mild cervical dysplasia: Secondary | ICD-10-CM

## 2023-10-12 LAB — POCT URINE PREGNANCY: Preg Test, Ur: NEGATIVE

## 2023-10-12 NOTE — Progress Notes (Signed)
Patient presents today for a colposcopy. She recently had an abnormal pap smear resulting in ASCUS/HPV+. No further concerns today. 

## 2023-10-12 NOTE — Progress Notes (Signed)
HPI:  Robin Mccall is a 27 y.o.  G0P0000  who presents today for evaluation and management of abnormal cervical cytology.    Dysplasia History:-History of CIN-2 18 months ago by colposcopically directed biopsies. Patient did not follow-up as directed until her recent Pap smear.  Most recent Pap shows ASCUS   HPV: Positive  ROS:  Pertinent items noted in HPI and remainder of comprehensive ROS otherwise negative.  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   Reflux    Syncope    Vitamin D deficiency    Wrist pain     Past Surgical History:  Procedure Laterality Date   DIAGNOSTIC LAPAROSCOPY     GANGLION CYST EXCISION Left 12/14/2015   Procedure: REMOVAL GANGLION OF WRIST;  Surgeon: Deeann Saint, MD;  Location: [facility];  Service: Orthopedics;  Laterality: Left;   LAPAROSCOPIC APPENDECTOMY N/A 06/07/2023   Procedure: APPENDECTOMY LAPAROSCOPIC;  Surgeon: Campbell Lerner, MD;  Location: [facility];  Service: General;  Laterality: N/A;   laporoscopic abdomen      SOCIAL HISTORY:  Social History   Substance and Sexual Activity  Alcohol Use Yes   Comment: soc    Social History   Substance and Sexual Activity  Drug Use No     Family History  Problem Relation Age of Onset   Stroke Maternal Aunt    Bone cancer Maternal Grandfather    Stomach cancer Maternal Great-grandfather    Kidney disease Neg Hx    Bladder Cancer Neg Hx     ALLERGIES:  Patient has no known allergies.  She has a current medication list which includes the following  prescription(s): albuterol and norgestimate-ethinyl estradiol triphasic.  Physical Exam: -Vitals:  BP 113/72   Pulse 83   Ht 5\' 3"  (1.6 m)   Wt 149 lb 9.6 oz (67.9 kg)   LMP 09/19/2023 (Exact Date)   BMI 26.50 kg/m   PROCEDURE: Colposcopy performed with 4% acetic acid after verbal consent obtained                           -Aceto-white Lesions Location(s): See above              -Biopsy performed at 1 o'clock               -ECC indicated and performed: No.     -Biopsy sites made hemostatic with pressure and Monsel's solution   -Satisfactory colposcopy: Yes.      -Evidence of Invasive cervical CA :  NO  ASSESSMENT:  Robin Mccall is a 27 y.o. G0P0000 here for  1. ASCUS with positive high risk HPV cervical   2. CIN II (cervical intraepithelial neoplasia II)   . Minimal acetowhite change noted  PLAN: 1.  I discussed the grading system of pap smears and HPV high risk viral types.  We will discuss management after  colpo results return.  No orders of the defined types were placed in this encounter.          F/U  Return in about 2 weeks (around 10/26/2023).  Robin Mccall ,MD 10/12/2023,2:25 PM

## 2023-10-17 LAB — SURGICAL PATHOLOGY

## 2023-10-19 LAB — CYTOLOGY - PAP
Adequacy: ABSENT
Comment: NEGATIVE
Comment: NEGATIVE
Comment: NEGATIVE
HPV 16: POSITIVE — AB
HPV 18 / 45: NEGATIVE
High risk HPV: POSITIVE — AB

## 2023-10-23 ENCOUNTER — Encounter: Payer: Self-pay | Admitting: Obstetrics and Gynecology

## 2023-10-23 DIAGNOSIS — B9689 Other specified bacterial agents as the cause of diseases classified elsewhere: Secondary | ICD-10-CM

## 2023-10-24 MED ORDER — METRONIDAZOLE 500 MG PO TABS
500.0000 mg | ORAL_TABLET | Freq: Two times a day (BID) | ORAL | 0 refills | Status: DC
Start: 2023-10-24 — End: 2024-01-12

## 2023-10-25 ENCOUNTER — Telehealth: Payer: Medicaid Other | Admitting: Obstetrics and Gynecology

## 2023-10-25 ENCOUNTER — Encounter: Payer: Self-pay | Admitting: Obstetrics and Gynecology

## 2023-10-25 DIAGNOSIS — R87810 Cervical high risk human papillomavirus (HPV) DNA test positive: Secondary | ICD-10-CM

## 2023-10-25 DIAGNOSIS — N870 Mild cervical dysplasia: Secondary | ICD-10-CM

## 2023-10-25 NOTE — Progress Notes (Signed)
Virtual Visit via Video Note  I connected with Robin Mccall on 10/25/23 at  7:35 AM EST by video and verified that I was speaking with the correct person using two identifiers.    [name] is a 27 y.o. G0P0000 who LMP was Patient's last menstrual period was 09/19/2023 (exact date). I discussed the limitations, risks, security and privacy concerns of performing an evaluation and management service by video and the availability of in person appointments. I also discussed with the patient that there may be a patient responsible charge related to this service. The patient expressed understanding and agreed to proceed.  Location of patient:  Home  Patient gave explicit verbal consent for video visit:  YES  Location of provider:  [facility] office  Persons other than physician and patient involved in provider conference:  None   Subjective:   History of Present Illness:    She has a history of CIN-2 by colposcopically directed biopsies approximately a year and a half ago.  Her most recent Pap smear shows ASCUS.  She underwent colposcopically directed biopsies with HPV viral typing at her latest visit.  She presents today to discuss the findings.  Hx: The following portions of the patient's history were reviewed and updated as appropriate:             She  has a past medical history of Abdominal pain, Allergic rhinitis, Asthma, Dermatitis, Dysmenorrhea, Glucose found in urine on examination, HA (headache), HLD (hyperlipidemia), Hydronephrosis, Onychodystrophy (09/24/2015), Reflux, Syncope, Vitamin D deficiency, and Wrist pain. She does not have any pertinent problems on file. She  has a past surgical history that includes laporoscopic abdomen; Diagnostic laparoscopy; Ganglion cyst excision (Left, 12/14/2015); and laparoscopic appendectomy (N/A, 06/07/2023). Her family history includes Bone cancer in her maternal grandfather; Stomach cancer in her maternal great-grandfather; Stroke in her maternal  aunt. She  reports that she has never smoked. She has never used smokeless tobacco. She reports current alcohol use. She reports that she does not use drugs. She has a current medication list which includes the following prescription(s): albuterol, metronidazole, and norgestimate-ethinyl estradiol triphasic. She has no known allergies.       Review of Systems:  Review of Systems  Constitutional: Denied constitutional symptoms, night sweats, recent illness, fatigue, fever, insomnia and weight loss.  Eyes: Denied eye symptoms, eye pain, photophobia, vision change and visual disturbance.  Ears/Nose/Throat/Neck: Denied ear, nose, throat or neck symptoms, hearing loss, nasal discharge, sinus congestion and sore throat.  Cardiovascular: Denied cardiovascular symptoms, arrhythmia, chest pain/pressure, edema, exercise intolerance, orthopnea and palpitations.  Respiratory: Denied pulmonary symptoms, asthma, pleuritic pain, productive sputum, cough, dyspnea and wheezing.  Gastrointestinal: Denied, gastro-esophageal reflux, melena, nausea and vomiting.  Genitourinary: Denied genitourinary symptoms including symptomatic vaginal discharge, pelvic relaxation issues, and urinary complaints.  Musculoskeletal: Denied musculoskeletal symptoms, stiffness, swelling, muscle weakness and myalgia.  Dermatologic: Denied dermatology symptoms, rash and scar.  Neurologic: Denied neurology symptoms, dizziness, headache, neck pain and syncope.  Psychiatric: Denied psychiatric symptoms, anxiety and depression.  Endocrine: Denied endocrine symptoms including hot flashes and night sweats.   Meds:   Current Outpatient Medications on File Prior to Visit  Medication Sig Dispense Refill   albuterol (VENTOLIN HFA) 108 (90 Base) MCG/ACT inhaler Inhale 2 puffs into the lungs every 6 (six) hours as needed for wheezing or shortness of breath. 8 g 2   metroNIDAZOLE (FLAGYL) 500 MG tablet Take 1 tablet (500 mg total) by mouth 2  (two) times daily. 14  tablet 0   Norgestimate-Ethinyl Estradiol Triphasic 0.18/0.215/0.25 MG-35 MCG tablet Take 1 tablet by mouth daily. 84 tablet 3   No current facility-administered medications on file prior to visit.    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 CIN I (cervical intraepithelial neoplasia I)   2. 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  We have discussed the natural course and history of HPV and its relationship to CIN and cervical cancer.  I have stressed the importance of close follow-ups as directed.  The nature of HPV 16 was discussed and that it is a much more aggressive form of the virus than several others.  The fact that her colposcopy findings have improved seems to show that her body is managing the virus and actually improving.  Per ASCCP guidelines recommend follow-up Pap smear in 1 year.  Based on that Pap will consider future colposcopies as necessary.  All questions answered.  Orders No orders of the defined types were placed in this encounter.   No orders of the defined types were placed in this encounter.     F/U  Return in about 1 year (around 10/24/2024) for Annual Physical. Elonda Husky, M.D. 10/25/2023 7:53 AM

## 2023-10-26 ENCOUNTER — Ambulatory Visit
Admission: RE | Admit: 2023-10-26 | Discharge: 2023-10-26 | Disposition: A | Discharge status: Home / Self Care | Payer: Medicaid Other | Source: Ambulatory Visit | Attending: Obstetrics and Gynecology | Admitting: Obstetrics and Gynecology

## 2023-10-26 DIAGNOSIS — R928 Other abnormal and inconclusive findings on diagnostic imaging of breast: Secondary | ICD-10-CM

## 2023-10-26 DIAGNOSIS — D241 Benign neoplasm of right breast: Secondary | ICD-10-CM | POA: Diagnosis present

## 2023-10-26 HISTORY — PX: BREAST BIOPSY: SHX20

## 2023-10-26 MED ORDER — LIDOCAINE 1 % OPTIME INJ - NO CHARGE
2.0000 mL | Freq: Once | INTRAMUSCULAR | Status: AC
  Administered 2023-10-26: 2 mL
  Filled 2023-10-26: qty 2

## 2023-10-26 MED ORDER — LIDOCAINE-EPINEPHRINE 1 %-1:100000 IJ SOLN
8.0000 mL | Freq: Once | INTRAMUSCULAR | Status: AC
Start: 2023-10-26 — End: 2023-10-26
  Administered 2023-10-26: 8 mL
  Filled 2023-10-26: qty 8

## 2023-10-27 LAB — SURGICAL PATHOLOGY

## 2023-12-01 ENCOUNTER — Other Ambulatory Visit (HOSPITAL_COMMUNITY)
Admission: RE | Admit: 2023-12-01 | Discharge: 2023-12-01 | Disposition: A | Discharge status: Home / Self Care | Payer: Medicaid Other | Source: Ambulatory Visit | Attending: Obstetrics and Gynecology | Admitting: Obstetrics and Gynecology

## 2023-12-01 ENCOUNTER — Ambulatory Visit (INDEPENDENT_AMBULATORY_CARE_PROVIDER_SITE_OTHER): Payer: Medicaid Other

## 2023-12-01 VITALS — BP 120/80 | Ht 63.0 in | Wt 151.0 lb

## 2023-12-01 DIAGNOSIS — N898 Other specified noninflammatory disorders of vagina: Secondary | ICD-10-CM

## 2023-12-01 MED ORDER — FLUCONAZOLE 150 MG PO TABS: 150.0000 mg | ORAL_TABLET | Freq: Once | ORAL | 0 refills | Status: DC

## 2023-12-01 NOTE — Progress Notes (Signed)
    NURSE VISIT NOTE  Subjective:    Patient ID: MCKENSI REDINGER, female    DOB: 03-24-1996, 28 y.o.   MRN: [id number]  HPI  Patient is a 28 y.o. G0P0000 female who presents for white vaginal discharge for 4 day(s). Denies abnormal vaginal bleeding or significant pelvic pain or fever. denies hematuria, urinary frequency, urinary urgency, flank pain, abdominal pain, pelvic pain, cloudy malordorous urine, genital rash, and genital irritation. Patient has history of known exposure to STD.   Objective:    There were no vitals taken for this visit.   @THIS  VISIT ONLY@  Assessment:   1. Vaginal itching       Plan:   GC and chlamydia DNA  probe sent to lab. Treatment: abstain from coitus during course of treatment and diflucan sent in.  ROV prn if symptoms persist or worsen.   Cornelius Moras, CMA

## 2023-12-05 LAB — CERVICOVAGINAL ANCILLARY ONLY
Candida Glabrata: NEGATIVE
Candida Vaginitis: NEGATIVE
Chlamydia: NEGATIVE
Comment: NEGATIVE
Comment: NEGATIVE
Comment: NEGATIVE
Comment: NEGATIVE
Comment: NORMAL
Neisseria Gonorrhea: NEGATIVE
Trichomonas: NEGATIVE

## 2024-01-10 ENCOUNTER — Other Ambulatory Visit: Payer: Self-pay | Admitting: Obstetrics and Gynecology

## 2024-01-10 ENCOUNTER — Telehealth: Payer: Self-pay

## 2024-01-10 DIAGNOSIS — N898 Other specified noninflammatory disorders of vagina: Secondary | ICD-10-CM

## 2024-01-10 MED ORDER — FLUCONAZOLE 150 MG PO TABS: 150.0000 mg | ORAL_TABLET | Freq: Once | ORAL | 0 refills | Status: DC

## 2024-01-10 NOTE — Telephone Encounter (Signed)
 I sent in diflucan Rx for pt. F/u prn.

## 2024-01-10 NOTE — Progress Notes (Signed)
 Rx RF diflucan for yeast vag sx after her period

## 2024-01-10 NOTE — Telephone Encounter (Signed)
 Patient states she gets yeast infections frequently around her period. Her period just ended. She is having some irritation when wiping "feels like sand paper" for three days. She denies discharge/itching at this time. Advised Helmut Muster is out of the office today. Will message for review.

## 2024-01-11 ENCOUNTER — Encounter: Payer: Self-pay | Admitting: Obstetrics and Gynecology

## 2024-01-11 NOTE — Telephone Encounter (Signed)
 Pt aware.

## 2024-01-12 ENCOUNTER — Encounter

## 2024-01-12 ENCOUNTER — Other Ambulatory Visit (HOSPITAL_COMMUNITY)
Admission: RE | Admit: 2024-01-12 | Discharge: 2024-01-12 | Disposition: A | Discharge status: Home / Self Care | Source: Ambulatory Visit | Attending: Obstetrics and Gynecology | Admitting: Obstetrics and Gynecology

## 2024-01-12 ENCOUNTER — Ambulatory Visit (INDEPENDENT_AMBULATORY_CARE_PROVIDER_SITE_OTHER): Admitting: Obstetrics and Gynecology

## 2024-01-12 ENCOUNTER — Encounter: Payer: Self-pay | Admitting: Obstetrics and Gynecology

## 2024-01-12 VITALS — BP 121/57 | HR 72 | Ht 63.0 in | Wt 147.9 lb

## 2024-01-12 VITALS — BP 121/57 | HR 72 | Resp 16 | Ht 63.0 in | Wt 147.0 lb

## 2024-01-12 DIAGNOSIS — Z113 Encounter for screening for infections with a predominantly sexual mode of transmission: Secondary | ICD-10-CM

## 2024-01-12 DIAGNOSIS — N898 Other specified noninflammatory disorders of vagina: Secondary | ICD-10-CM

## 2024-01-12 LAB — POCT URINALYSIS DIPSTICK
Bilirubin, UA: NEGATIVE
Blood, UA: POSITIVE
Glucose, UA: NEGATIVE
Ketones, UA: NEGATIVE
Leukocytes, UA: NEGATIVE
Nitrite, UA: NEGATIVE
Protein, UA: NEGATIVE
Spec Grav, UA: 1.02 (ref 1.010–1.025)
Urobilinogen, UA: 0.2 U/dL
pH, UA: 7 (ref 5.0–8.0)

## 2024-01-12 NOTE — Progress Notes (Signed)
 GYNECOLOGY PROGRESS NOTE  Subjective:    Patient ID: Robin Mccall, female    DOB: April 14, 1996, 28 y.o.   MRN: [id number]  HPI  Patient is a 28 y.o. G0P0000 female who presents for evaluation for pain when wiping and increased in the number of days her cycle is on.  Typically her cycles last for ~ 3-4 days, however most recent one has lasted 6.  Her pain tends to be at the inner portion of the labia minora, mostly hurts with wiping or using tampons, has been ongoing for 5 days. Does not think she has a vaginal discharge, but cannot tell due to recent menses.   Of note, patient does note an increased amount of stress as she reports that she recently broke up with her boyfriend, reconnected with another old boyfriend, and just had a death in the family in the past 3 weeks.   The following portions of the patient's history were reviewed and updated as appropriate:   She  has a past medical history of Abdominal pain, Allergic rhinitis, Asthma, Dermatitis, Dysmenorrhea, Glucose found in urine on examination, HA (headache), HLD (hyperlipidemia), Hydronephrosis, Onychodystrophy (09/24/2015), Reflux, Syncope, Vitamin D deficiency, and Wrist pain.  She  has a past surgical history that includes laporoscopic abdomen; Diagnostic laparoscopy; Ganglion cyst excision (Left, 12/14/2015); laparoscopic appendectomy (N/A, 06/07/2023); and Breast biopsy (Right, 10/26/2023).  Her family history includes Bone cancer in her maternal grandfather; Stomach cancer in her maternal great-grandfather; Stroke in her maternal aunt.  She  reports that she has never smoked. She has never used smokeless tobacco. She reports current alcohol use. She reports that she does not use drugs.  Current Outpatient Medications on File Prior to Visit  Medication Sig Dispense Refill   albuterol (VENTOLIN HFA) 108 (90 Base) MCG/ACT inhaler Inhale 2 puffs into the lungs every 6 (six) hours as needed for wheezing or shortness of breath. 8 g 2    Norgestimate-Ethinyl Estradiol Triphasic 0.18/0.215/0.25 MG-35 MCG tablet Take 1 tablet by mouth daily. 84 tablet 3   No current facility-administered medications on file prior to visit.   She has no known allergies..  Review of Systems Pertinent items noted in HPI and remainder of comprehensive ROS otherwise negative.   Objective:   Blood pressure (!) 121/57, pulse 72, resp. rate 16, height 5\' 3"  (1.6 m), weight 147 lb (66.7 kg), last menstrual period 01/06/2024. Body mass index is 26.04 kg/m. General appearance: alert and no distress Abdomen: soft, non-tender; bowel sounds normal; no masses,  no organomegaly Pelvis: external genitalia normal with exception of small slit/tear on inner portion of labia minora.  Rectovaginal septum normal.  Vagina with small amount of dark brown blood in vault.  Cervix normal appearing, no lesions and no motion tenderness.  Bimanual exam not performed.    Labs:  Results for orders placed or performed in visit on 01/12/24  POCT Urinalysis Dipstick   Collection Time: 01/12/24  3:05 PM  Result Value Ref Range   Color, UA     Clarity, UA     Glucose, UA Negative Negative   Bilirubin, UA Negative    Ketones, UA Negative    Spec Grav, UA 1.020 1.010 - 1.025   Blood, UA Positive    pH, UA 7.0 5.0 - 8.0   Protein, UA Negative Negative   Urobilinogen, UA 0.2 0.2 or 1.0 E.U./dL   Nitrite, UA Negative    Leukocytes, UA Negative Negative   Appearance     Odor  Assessment:   1. Vaginal irritation   2. Screen for STD (sexually transmitted disease)      Plan:   1. Vaginal irritation (Primary) - Possible small labial laceration of the left minora, however cannot definitively rule out possible HSV infection (however no blistering noted).  Discussed option of serology testing as small area may not be able to give accurate results based on vaginal culture. Discussed topical agents to utilize for discomfort for now.    2. Screen for STD (sexually  transmitted disease) - Discussion had with patient regarding ruling out HSV. Also discusse doption  of further STD screening in light of recent changing of relationships. Patient does note history of contracting STDs twice with most recent relationship. Ok to have testing performed today, including HIV.  - HSV 1 and 2 Ab, IgG - HIV Antibody (routine testing w rflx) - RPR - Hepatitis C antibody - Hepatitis B Surface AntiBODY - Cervicovaginal ancillary   Hildred Laser, MD [facility]

## 2024-01-13 ENCOUNTER — Other Ambulatory Visit: Payer: Self-pay | Admitting: Obstetrics and Gynecology

## 2024-01-13 ENCOUNTER — Encounter: Payer: Self-pay | Admitting: Obstetrics and Gynecology

## 2024-01-13 DIAGNOSIS — B009 Herpesviral infection, unspecified: Secondary | ICD-10-CM

## 2024-01-13 LAB — RPR: RPR Ser Ql: NONREACTIVE

## 2024-01-13 LAB — HEPATITIS B SURFACE ANTIBODY,QUALITATIVE: Hep B Surface Ab, Qual: REACTIVE

## 2024-01-13 LAB — HSV 1 AND 2 AB, IGG
HSV 1 Glycoprotein G Ab, IgG: REACTIVE — AB
HSV 2 IgG, Type Spec: REACTIVE — AB

## 2024-01-13 LAB — HEPATITIS C ANTIBODY: Hep C Virus Ab: NONREACTIVE

## 2024-01-13 LAB — HIV ANTIBODY (ROUTINE TESTING W REFLEX): HIV Screen 4th Generation wRfx: NONREACTIVE

## 2024-01-13 MED ORDER — VALACYCLOVIR HCL 1 G PO TABS: 1000.0000 mg | ORAL_TABLET | Freq: Every day | ORAL | 2 refills | Status: DC

## 2024-01-15 ENCOUNTER — Ambulatory Visit: Admitting: Obstetrics and Gynecology

## 2024-01-16 LAB — CERVICOVAGINAL ANCILLARY ONLY
Bacterial Vaginitis (gardnerella): NEGATIVE
Candida Glabrata: NEGATIVE
Candida Vaginitis: POSITIVE — AB
Chlamydia: NEGATIVE
Comment: NEGATIVE
Comment: NEGATIVE
Comment: NEGATIVE
Comment: NEGATIVE
Comment: NEGATIVE
Comment: NORMAL
Neisseria Gonorrhea: NEGATIVE
Trichomonas: NEGATIVE

## 2024-01-17 MED ORDER — FLUCONAZOLE 150 MG PO TABS: 150.0000 mg | ORAL_TABLET | Freq: Once | ORAL | 0 refills | Status: AC

## 2024-01-17 NOTE — Addendum Note (Signed)
 Addended by: Tommie Raymond on: 01/17/2024 08:47 AM   Modules accepted: Orders

## 2024-01-17 NOTE — Telephone Encounter (Signed)
 [name] has sent in medication please view other message.

## 2024-01-23 ENCOUNTER — Ambulatory Visit: Payer: Medicaid Other

## 2024-01-24 ENCOUNTER — Ambulatory Visit

## 2024-01-26 ENCOUNTER — Ambulatory Visit

## 2024-01-26 VITALS — BP 135/69 | HR 66 | Ht 63.0 in | Wt 148.8 lb

## 2024-01-26 DIAGNOSIS — Z23 Encounter for immunization: Secondary | ICD-10-CM | POA: Diagnosis not present

## 2024-01-26 NOTE — Progress Notes (Signed)
    NURSE VISIT NOTE  Subjective:    Patient ID: ZOIE SARIN, female    DOB: [birth year], 28 y.o.   MRN: [id number]  HPI  Patient is a 28 y.o. G0P0000 female Single Caucasian female who presents for her second Gardasil injection. Order to administer given by Althea Grimmer, PAC on 09/15/23.   Objective:    Given by: Georgiana Shore, CMA Site:  left deltoid     Assessment:   1. Need for HPV vaccine      Plan:   Patient will return in 4 months for third injection.    Loman Chroman, CMA

## 2024-01-30 ENCOUNTER — Other Ambulatory Visit: Payer: Self-pay | Admitting: Obstetrics and Gynecology

## 2024-01-30 ENCOUNTER — Encounter: Payer: Self-pay | Admitting: Obstetrics and Gynecology

## 2024-01-30 DIAGNOSIS — N898 Other specified noninflammatory disorders of vagina: Secondary | ICD-10-CM

## 2024-01-30 MED ORDER — FLUCONAZOLE 150 MG PO TABS
150.0000 mg | ORAL_TABLET | Freq: Once | ORAL | 1 refills | Status: AC
Start: 2024-01-30 — End: 2024-01-30

## 2024-05-03 ENCOUNTER — Encounter: Payer: Self-pay | Admitting: Obstetrics and Gynecology

## 2024-05-27 ENCOUNTER — Ambulatory Visit: Payer: Self-pay

## 2024-05-27 ENCOUNTER — Other Ambulatory Visit (HOSPITAL_COMMUNITY)
Admission: RE | Admit: 2024-05-27 | Discharge: 2024-05-27 | Disposition: A | Discharge status: Home / Self Care | Source: Ambulatory Visit | Attending: Obstetrics and Gynecology | Admitting: Obstetrics and Gynecology

## 2024-05-27 VITALS — BP 121/73 | HR 63 | Wt 146.7 lb

## 2024-05-27 DIAGNOSIS — N898 Other specified noninflammatory disorders of vagina: Secondary | ICD-10-CM | POA: Insufficient documentation

## 2024-05-27 DIAGNOSIS — Z23 Encounter for immunization: Secondary | ICD-10-CM | POA: Diagnosis not present

## 2024-05-27 NOTE — Addendum Note (Signed)
 Addended by: DONELDA BURNARD CROME on: 05/27/2024 04:12 PM   Modules accepted: Orders

## 2024-05-27 NOTE — Progress Notes (Signed)
    NURSE VISIT NOTE  Subjective:    Patient ID: Robin Mccall, female    DOB: 1996-09-15, 28 y.o.   MRN: [id number]  HPI  Patient is a 28 y.o. G0P0000 female Single Caucasian female who presents for her third Gardasil injection. Order to administer given by Bernarda Schroeder, PAC on 09/15/23. Patient also requested a swab for a on going yeast infection, already went to urgent care 2 weekends ago was told it was a UTI she started the antibiotic then got a phone call saying it was a yeast infection and start Diflucan  and still no better.    Objective:    BP 121/73 (BP Location: Left Arm, Patient Position: Sitting, Cuff Size: Normal)   Pulse 63   Wt 146 lb 11.2 oz (66.5 kg)   BMI 25.99 kg/m   28 y.o. LMP:  05/06/24  Contraception:  Pills Given by: Burnard Ro, CMA Site:  left deltoid  Lab Review  No results found for any visits on 05/27/24.    Assessment:   No diagnosis found.   Plan:   Patient will return in prn. Gardasil series complete.    Burnard LITTIE Ro, CMA

## 2024-05-29 LAB — CERVICOVAGINAL ANCILLARY ONLY
Bacterial Vaginitis (gardnerella): NEGATIVE
Candida Glabrata: NEGATIVE
Candida Vaginitis: NEGATIVE
Chlamydia: NEGATIVE
Comment: NEGATIVE
Comment: NEGATIVE
Comment: NEGATIVE
Comment: NEGATIVE
Comment: NEGATIVE
Comment: NORMAL
Neisseria Gonorrhea: NEGATIVE
Trichomonas: NEGATIVE

## 2024-05-30 ENCOUNTER — Ambulatory Visit: Payer: Self-pay

## 2024-07-06 ENCOUNTER — Encounter: Payer: Self-pay | Admitting: Obstetrics and Gynecology

## 2024-07-09 ENCOUNTER — Ambulatory Visit: Admitting: Licensed Practical Nurse

## 2024-07-09 MED ORDER — FLUCONAZOLE 150 MG PO TABS
150.0000 mg | ORAL_TABLET | Freq: Once | ORAL | 0 refills | Status: AC
Start: 2024-07-09 — End: 2024-07-09

## 2024-07-09 NOTE — Telephone Encounter (Signed)
 Message sent to provider per protocol

## 2024-07-17 NOTE — Progress Notes (Unsigned)
    GYNECOLOGY PROGRESS NOTE  Subjective:    Patient ID: Robin Mccall, female    DOB: February 25, 1996, 28 y.o.   MRN: [id number]  HPI  Patient is a 28 y.o. G0P0000 female who presents for evaluation of lump on her labia.  {Common ambulatory SmartLinks:19316}  Review of Systems {ros; complete:30496}   Objective:   There were no vitals taken for this visit. There is no height or weight on file to calculate BMI. General appearance: {general exam:16600} Abdomen: {abdominal exam:16834} Pelvic: {pelvic exam:16852::cervix normal in appearance,external genitalia normal,no adnexal masses or tenderness,no cervical motion tenderness,rectovaginal septum normal,uterus normal size, shape, and consistency,vagina normal without discharge} Extremities: {extremity exam:5109} Neurologic: {neuro exam:17854}   Assessment:   No diagnosis found.   Plan:   There are no diagnoses linked to this encounter.    Damien Parsley, CNM [facility]

## 2024-07-18 ENCOUNTER — Other Ambulatory Visit (HOSPITAL_COMMUNITY)
Admission: RE | Admit: 2024-07-18 | Discharge: 2024-07-18 | Disposition: A | Discharge status: Home / Self Care | Source: Ambulatory Visit | Attending: Certified Nurse Midwife | Admitting: Certified Nurse Midwife

## 2024-07-18 ENCOUNTER — Ambulatory Visit: Admitting: Certified Nurse Midwife

## 2024-07-18 VITALS — BP 119/67 | HR 72 | Resp 16 | Ht 63.0 in | Wt 149.0 lb

## 2024-07-18 DIAGNOSIS — B009 Herpesviral infection, unspecified: Secondary | ICD-10-CM | POA: Diagnosis not present

## 2024-07-18 DIAGNOSIS — Z113 Encounter for screening for infections with a predominantly sexual mode of transmission: Secondary | ICD-10-CM | POA: Insufficient documentation

## 2024-07-18 DIAGNOSIS — N898 Other specified noninflammatory disorders of vagina: Secondary | ICD-10-CM | POA: Diagnosis not present

## 2024-07-18 MED ORDER — FLUCONAZOLE 150 MG PO TABS: ORAL_TABLET | ORAL | 0 refills | Status: AC

## 2024-07-18 MED ORDER — VALACYCLOVIR HCL 1 G PO TABS
1000.0000 mg | ORAL_TABLET | Freq: Every day | ORAL | 3 refills | Status: AC
Start: 2024-07-18 — End: ?

## 2024-07-21 LAB — HERPES SIMPLEX VIRUS CULTURE

## 2024-07-22 ENCOUNTER — Ambulatory Visit: Payer: Self-pay | Admitting: Certified Nurse Midwife

## 2024-07-22 ENCOUNTER — Encounter: Payer: Self-pay | Admitting: Obstetrics

## 2024-07-22 ENCOUNTER — Ambulatory Visit (INDEPENDENT_AMBULATORY_CARE_PROVIDER_SITE_OTHER): Admitting: Obstetrics

## 2024-07-22 VITALS — BP 119/68 | HR 70 | Ht 63.0 in | Wt 147.0 lb

## 2024-07-22 DIAGNOSIS — L03315 Cellulitis of perineum: Secondary | ICD-10-CM

## 2024-07-22 DIAGNOSIS — N764 Abscess of vulva: Secondary | ICD-10-CM

## 2024-07-22 LAB — CERVICOVAGINAL ANCILLARY ONLY
Bacterial Vaginitis (gardnerella): NEGATIVE
Candida Glabrata: NEGATIVE
Candida Vaginitis: NEGATIVE
Chlamydia: NEGATIVE
Comment: NEGATIVE
Comment: NEGATIVE
Comment: NEGATIVE
Comment: NEGATIVE
Comment: NEGATIVE
Comment: NORMAL
Neisseria Gonorrhea: NEGATIVE
Trichomonas: NEGATIVE

## 2024-07-22 MED ORDER — CEFTRIAXONE SODIUM 500 MG IJ SOLR
500.0000 mg | Freq: Once | INTRAMUSCULAR | Status: AC
  Administered 2024-07-22 (×2): 500 mg via INTRAMUSCULAR

## 2024-07-22 MED ORDER — CEPHALEXIN 500 MG PO CAPS: 500.0000 mg | ORAL_CAPSULE | Freq: Two times a day (BID) | ORAL | 0 refills | Status: AC

## 2024-07-22 MED ORDER — CEFTRIAXONE SODIUM 500 MG IJ SOLR: 500.0000 mg | Freq: Once | INTRAMUSCULAR | Status: AC

## 2024-07-22 NOTE — Progress Notes (Unsigned)
    GYNECOLOGY PROGRESS NOTE  Subjective:  PCP: Patient, No Pcp Per  Patient ID: Robin Mccall, female    DOB: Mar 07, 1996, 28 y.o.   MRN: [id number]  HPI  Patient is a 28 y.o. G0P0000 female who presents for a mass on left labia size that is growing and becoming more painful. 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 HSV swab was negative. Pt states this mass has been getting bigger ever since 9/11.   The following portions of the patient's history were reviewed and updated as appropriate: allergies, current medications, past family history, past medical history, past social history, past surgical history, and problem list.  Review of Systems Pertinent items are noted in HPI.   Objective:   Blood pressure 119/68, pulse 70, height 5' 3 (1.6 m), weight 147 lb (66.7 kg), last menstrual period 06/30/2024. Body mass index is 26.04 kg/m.  General appearance: alert and cooperative Abdomen: soft, non-tender; bowel sounds normal; no masses,  no organomegaly Pelvic: L labia majora: anterior half red and swollen, fluctuant and indurated, with a circumscribed area anteriorly near clitoral hood.  Extremities: extremities normal, atraumatic, no cyanosis or edema Neurologic: Grossly normal  Diagnosis: abscess - Location: L anterior labia majora Procedure: Incision & drainage Informed consent:  Discussed risks (infection, pain, bleeding, bruising, numbness, and recurrence of the condition) and benefits of the procedure, as well as the alternatives.  Informed consent was obtained. Anesthesia: 1% Lidocaine  The area was prepared and draped in a standard fashion. The lesion drained pus and straw-colored fluid. The lesion was multiloculated. Multiple cavities were opened and drained. Pieces of cyst wall were extracted. Antibiotic ointment and a sterile pressure dressing were applied. The patient tolerated the procedure  well.   Assessment/Plan:   1. Left genital labial abscess   2. Cellulitis of perineum    28 y.o. G0P0000 with L labial abscess, drained today as above without complication.  -Wound culture obtained and sent -Wound care and hygiene for home reviewed -Some surrounding cellulitis, Rocephin  1g given in clinic and Rx for Keflex  for 7 days, complete until gone. -Pt has Diflucan  at home, take after abx if yeast infxn symptoms arise.     Estil Mangle, DO [facility]

## 2024-07-23 DIAGNOSIS — N764 Abscess of vulva: Secondary | ICD-10-CM | POA: Insufficient documentation

## 2024-07-23 DIAGNOSIS — L03315 Cellulitis of perineum: Secondary | ICD-10-CM | POA: Insufficient documentation

## 2024-07-24 NOTE — Telephone Encounter (Signed)
 Pt is aware to add ice or heat to the swollen area. Can take tylenol /ibuprofen  for the pain, keep taking the antibiotic and give the area time to heal.

## 2024-07-29 ENCOUNTER — Encounter: Payer: Self-pay | Admitting: Obstetrics

## 2024-07-29 LAB — ANAEROBIC AND AEROBIC CULTURE

## 2024-08-13 ENCOUNTER — Other Ambulatory Visit: Payer: Self-pay

## 2024-08-13 ENCOUNTER — Emergency Department
Admission: EM | Admit: 2024-08-13 | Discharge: 2024-08-13 | Disposition: A | Discharge status: Home / Self Care | Attending: Emergency Medicine | Admitting: Emergency Medicine

## 2024-08-13 ENCOUNTER — Encounter: Payer: Self-pay | Admitting: Emergency Medicine

## 2024-08-13 DIAGNOSIS — G43909 Migraine, unspecified, not intractable, without status migrainosus: Secondary | ICD-10-CM | POA: Diagnosis present

## 2024-08-13 LAB — CBC
HCT: 40.3 % (ref 36.0–46.0)
Hemoglobin: 13.6 g/dL (ref 12.0–15.0)
MCH: 28.9 pg (ref 26.0–34.0)
MCHC: 33.7 g/dL (ref 30.0–36.0)
MCV: 85.7 fL (ref 80.0–100.0)
Platelets: 286 K/uL (ref 150–400)
RBC: 4.7 MIL/uL (ref 3.87–5.11)
RDW: 12.6 % (ref 11.5–15.5)
WBC: 5.8 K/uL (ref 4.0–10.5)
nRBC: 0 % (ref 0.0–0.2)

## 2024-08-13 LAB — URINALYSIS, ROUTINE W REFLEX MICROSCOPIC
Bilirubin Urine: NEGATIVE
Glucose, UA: 50 mg/dL — AB
Ketones, ur: NEGATIVE mg/dL
Leukocytes,Ua: NEGATIVE
Nitrite: NEGATIVE
Protein, ur: NEGATIVE mg/dL
Specific Gravity, Urine: 1.019 (ref 1.005–1.030)
pH: 6 (ref 5.0–8.0)

## 2024-08-13 LAB — COMPREHENSIVE METABOLIC PANEL WITH GFR
ALT: 25 U/L (ref 0–44)
AST: 21 U/L (ref 15–41)
Albumin: 3.6 g/dL (ref 3.5–5.0)
Alkaline Phosphatase: 73 U/L (ref 38–126)
Anion gap: 8 (ref 5–15)
BUN: 15 mg/dL (ref 6–20)
CO2: 22 mmol/L (ref 22–32)
Calcium: 8.8 mg/dL — ABNORMAL LOW (ref 8.9–10.3)
Chloride: 106 mmol/L (ref 98–111)
Creatinine, Ser: 0.64 mg/dL (ref 0.44–1.00)
GFR, Estimated: >60 mL/min (ref >60)
Glucose, Bld: 145 mg/dL — ABNORMAL HIGH (ref 70–99)
Potassium: 3.8 mmol/L (ref 3.5–5.1)
Sodium: 136 mmol/L (ref 135–145)
Total Bilirubin: 0.3 mg/dL (ref 0.0–1.2)
Total Protein: 7 g/dL (ref 6.5–8.1)

## 2024-08-13 LAB — POC URINE PREG, ED: Preg Test, Ur: NEGATIVE

## 2024-08-13 LAB — LIPASE, BLOOD: Lipase: 31 U/L (ref 11–51)

## 2024-08-13 MED ORDER — DIPHENHYDRAMINE HCL 50 MG/ML IJ SOLN
25.0000 mg | Freq: Once | INTRAMUSCULAR | Status: AC
  Administered 2024-08-13: 25 mg via INTRAVENOUS
  Filled 2024-08-13: qty 1

## 2024-08-13 MED ORDER — METOCLOPRAMIDE HCL 5 MG/ML IJ SOLN: 20.0000 mg | Freq: Once | INTRAVENOUS | Status: DC

## 2024-08-13 MED ORDER — SODIUM CHLORIDE 0.9 % IV BOLUS
500.0000 mL | Freq: Once | INTRAVENOUS | Status: AC
  Administered 2024-08-13: 500 mL via INTRAVENOUS

## 2024-08-13 MED ORDER — METOCLOPRAMIDE HCL 5 MG/ML IJ SOLN
10.0000 mg | Freq: Once | INTRAMUSCULAR | Status: AC
  Administered 2024-08-13: 10 mg via INTRAVENOUS
  Filled 2024-08-13: qty 2

## 2024-08-13 MED ORDER — KETOROLAC TROMETHAMINE 30 MG/ML IJ SOLN
30.0000 mg | Freq: Once | INTRAMUSCULAR | Status: AC
  Administered 2024-08-13: 30 mg via INTRAVENOUS
  Filled 2024-08-13: qty 1

## 2024-08-13 MED ORDER — METOCLOPRAMIDE HCL 5 MG/ML IJ SOLN: 10.0000 mg | Freq: Once | INTRAMUSCULAR | Status: DC

## 2024-08-13 NOTE — ED Provider Notes (Signed)
 [facility] Provider Note    Event Date/Time   First MD Initiated Contact with Patient 08/13/24 1107     (approximate)   History   Migraine   HPI  Robin Mccall is a 28 y.o. female with a history of migraine headaches presents with complaints of headache x 2 to 3 days with nausea, vomiting, she reports this is typical of her headaches.  Denies fevers or chills.  No neurodeficits.  No significant improvement with over-the-counter medication     Physical Exam   Triage Vital Signs: ED Triage Vitals  Encounter Vitals Group     BP 08/13/24 1030 (!) 135/95     Girls Systolic BP Percentile --      Girls Diastolic BP Percentile --      Boys Systolic BP Percentile --      Boys Diastolic BP Percentile --      Pulse Rate 08/13/24 1030 95     Resp 08/13/24 1030 17     Temp 08/13/24 1032 98.8 F (37.1 C)     Temp Source 08/13/24 1032 Oral     SpO2 08/13/24 1030 98 %     Weight 08/13/24 1030 65.8 kg (145 lb)     Height 08/13/24 1030 1.6 m (5' 3)     Head Circumference --      Peak Flow --      Pain Score 08/13/24 1030 6     Pain Loc --      Pain Education --      Exclude from Growth Chart --     Most recent vital signs: Vitals:   08/13/24 1032 08/13/24 1220  BP:  112/61  Pulse:  72  Resp:  16  Temp: 98.8 F (37.1 C)   SpO2:  98%     General: Awake, no distress.  Well-appearing and in no acute distress, nontoxic CV:  Good peripheral perfusion.  Resp:  Normal effort.  Abd:  No distention.  Other:  Cranial nerves II to XII are normal, normal neurologic exam, ambulating well without difficulty, normal strength in all extremities.  PERRLA, EOMI   ED Results / Procedures / Treatments   Labs (all labs ordered are listed, but only abnormal results are displayed) Labs Reviewed  COMPREHENSIVE METABOLIC PANEL WITH GFR - Abnormal; Notable for the following components:      Result Value   Glucose, Bld 145 (*)    Calcium 8.8 (*)    All other  components within normal limits  URINALYSIS, ROUTINE W REFLEX MICROSCOPIC - Abnormal; Notable for the following components:   Color, Urine YELLOW (*)    APPearance HAZY (*)    Glucose, UA 50 (*)    Hgb urine dipstick SMALL (*)    Bacteria, UA MANY (*)    All other components within normal limits  LIPASE, BLOOD  CBC  POC URINE PREG, ED     EKG     RADIOLOGY     PROCEDURES:  Critical Care performed:   Procedures   MEDICATIONS ORDERED IN ED: Medications  ketorolac  (TORADOL ) 30 MG/ML injection 30 mg (30 mg Intravenous Given 08/13/24 1139)  diphenhydrAMINE  (BENADRYL ) injection 25 mg (25 mg Intravenous Given 08/13/24 1137)  sodium chloride  0.9 % bolus 500 mL (0 mLs Intravenous Stopped 08/13/24 1213)  metoCLOPramide (REGLAN) injection 10 mg (10 mg Intravenous Given 08/13/24 1141)     IMPRESSION / MDM / ASSESSMENT AND PLAN / ED COURSE  I reviewed the triage vital signs  and the nursing notes. Patient's presentation is most consistent with severe exacerbation of chronic illness.  Patient with a history of migraines presents with likely migraine headache, no red flag symptoms, afebrile, well-appearing in general, will treat with IV Reglan, IV Toradol , IV Benadryl , IV fluids, she is not driving.  Lab work reviewed and is reassuring  After treatment patient feeling much better, requesting to nurse that the IV be removed that she can leave.  Appropriate for discharge at this time      FINAL CLINICAL IMPRESSION(S) / ED DIAGNOSES   Final diagnoses:  Migraine without status migrainosus, not intractable, unspecified migraine type     Rx / DC Orders   ED Discharge Orders     None        Note:  This document was prepared using Dragon voice recognition software and may include unintentional dictation errors.   Arlander Charleston, MD 08/13/24 [phone]

## 2024-08-13 NOTE — ED Triage Notes (Signed)
 Patient to ED via POV for migraine x4 days. Having N/V but able to eat and drink. Hx of migraine.

## 2024-08-14 NOTE — ED Notes (Addendum)
 08/14/24 10am   Patient called to ask about her labs.  Elevated glucose and also had glucose in urine.  Explained that the best test for diabetes is A1C which can be done by pcp.  She says she does not have a pcp because they closed.  Encouraged her to get a pcp--call the number on her instructions or call her insurance to find a pcp that is covered.

## 2024-09-17 ENCOUNTER — Other Ambulatory Visit: Payer: Self-pay | Admitting: Obstetrics and Gynecology

## 2024-09-17 DIAGNOSIS — Z3041 Encounter for surveillance of contraceptive pills: Secondary | ICD-10-CM

## 2024-09-18 MED ORDER — NORGESTIM-ETH ESTRAD TRIPHASIC 0.18/0.215/0.25 MG-35 MCG PO TABS: 1.0000 | ORAL_TABLET | Freq: Every day | ORAL | 0 refills | Status: DC

## 2024-09-18 NOTE — Telephone Encounter (Signed)
 Per Dr. Starla:  She just needs a pap smear.

## 2024-09-18 NOTE — Telephone Encounter (Signed)
 Pls change visit type to Annual.

## 2024-09-18 NOTE — Addendum Note (Signed)
 Addended by: British Moyd on: 09/18/2024 01:32 PM   Modules accepted: Orders

## 2024-10-21 ENCOUNTER — Encounter: Payer: Self-pay | Admitting: Obstetrics and Gynecology

## 2024-10-22 ENCOUNTER — Ambulatory Visit: Admitting: Obstetrics & Gynecology

## 2024-10-22 ENCOUNTER — Other Ambulatory Visit: Payer: Self-pay | Admitting: Obstetrics & Gynecology

## 2024-10-22 DIAGNOSIS — Z3041 Encounter for surveillance of contraceptive pills: Secondary | ICD-10-CM

## 2024-10-22 MED ORDER — NORGESTIM-ETH ESTRAD TRIPHASIC 0.18/0.215/0.25 MG-35 MCG PO TABS: 1.0000 | ORAL_TABLET | Freq: Every day | ORAL | 0 refills | Status: AC

## 2024-10-22 NOTE — Telephone Encounter (Signed)
Patient cancelled her appointment. 

## 2024-10-22 NOTE — Progress Notes (Signed)
 OCP's refilled.

## 2024-11-13 ENCOUNTER — Encounter: Payer: Self-pay | Admitting: Obstetrics and Gynecology

## 2024-11-18 ENCOUNTER — Ambulatory Visit (INDEPENDENT_AMBULATORY_CARE_PROVIDER_SITE_OTHER): Admitting: Obstetrics & Gynecology

## 2024-11-18 ENCOUNTER — Other Ambulatory Visit (HOSPITAL_COMMUNITY)
Admission: RE | Admit: 2024-11-18 | Discharge: 2024-11-18 | Disposition: A | Source: Ambulatory Visit | Attending: Obstetrics & Gynecology | Admitting: Obstetrics & Gynecology

## 2024-11-18 ENCOUNTER — Encounter: Payer: Self-pay | Admitting: Obstetrics & Gynecology

## 2024-11-18 VITALS — BP 114/72 | HR 75 | Ht 63.0 in | Wt 147.0 lb

## 2024-11-18 DIAGNOSIS — Z124 Encounter for screening for malignant neoplasm of cervix: Secondary | ICD-10-CM

## 2024-11-18 DIAGNOSIS — Z0000 Encounter for general adult medical examination without abnormal findings: Secondary | ICD-10-CM

## 2024-11-18 DIAGNOSIS — R87810 Cervical high risk human papillomavirus (HPV) DNA test positive: Secondary | ICD-10-CM

## 2024-11-18 DIAGNOSIS — N898 Other specified noninflammatory disorders of vagina: Secondary | ICD-10-CM

## 2024-11-18 DIAGNOSIS — Z01419 Encounter for gynecological examination (general) (routine) without abnormal findings: Secondary | ICD-10-CM | POA: Diagnosis present

## 2024-11-18 NOTE — Progress Notes (Signed)
 "   GYNECOLOGY ANNUAL PHYSICAL EXAM PROGRESS NOTE  Subjective:    Robin Mccall is a 29 y.o. engaged G0P0000 female who presents for an annual exam.  The patient is sexually active. The patient participates in regular exercise: yes. Has the patient ever been transfused or tattooed?: yes. The patient reports that there is not domestic violence in her life.   The patient has the following complaints today: She needs a refill of her OCPs and would like diflucan  since she has frequent infection.  In the past she has taken a diflucan  weekly.  Menstrual History: Menarche age: 29 Patient's last menstrual period was 11/18/2024 (exact date). Period Cycle (Days): 28 Period Duration (Days): 4 Period Pattern: Regular Menstrual Flow: Moderate Dysmenorrhea: (!) Severe Dysmenorrhea Symptoms: Cramping   Gynecologic History:  Contraception: OCP (estrogen/progesterone) History of STI's:  Last Pap: 2024. Results were: abnormal.  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   Reflux    Syncope    Vitamin D deficiency    Wrist pain     Past Surgical History:  Procedure Laterality Date   BREAST BIOPSY Right 10/26/2023   US  RT BREAST BX W LOC DEV 1ST LESION IMG BX SPEC US  GUIDE 10/26/2023 [facility]   DIAGNOSTIC LAPAROSCOPY      GANGLION CYST EXCISION Left 12/14/2015   Procedure: REMOVAL GANGLION OF WRIST;  Surgeon: Kayla Pinal, MD;  Location: [facility];  Service: Orthopedics;  Laterality: Left;   LAPAROSCOPIC APPENDECTOMY N/A 06/07/2023   Procedure: APPENDECTOMY LAPAROSCOPIC;  Surgeon: Lane Shope, MD;  Location: [facility];  Service: General;  Laterality: N/A;   laporoscopic abdomen      Family History  Problem Relation Age of Onset   Stroke Maternal Aunt    Bone cancer Maternal Grandfather    Stomach cancer Maternal Great-grandfather    Kidney disease Neg Hx    Bladder Cancer Neg Hx     Social History   Socioeconomic History   Marital status: Single    Spouse name: Not on file   Number of children: Not on file   Years of education: Not on file   Highest education level: Not on file  Occupational History   Not on file  Tobacco Use   Smoking status: Never   Smokeless tobacco: Never  Vaping Use   Vaping status: Never Used  Substance and Sexual Activity   Alcohol use: Not Currently   Drug use: No   Sexual activity: Yes    Birth control/protection: Pill  Other Topics Concern   Not on file  Social History Narrative  Not on file   Social Drivers of Health   Tobacco Use: Low Risk (11/18/2024)   Patient History    Smoking Tobacco Use: Never    Smokeless Tobacco Use: Never    Passive Exposure: Not on file  Financial Resource Strain: Not on file  Food Insecurity: Not on file  Transportation Needs: Not on file  Physical Activity: Not on file  Stress: Not on file  Social Connections: Not on file  Intimate Partner Violence: Not on file  Depression (EYV7-0): Not on file  Alcohol Screen: Not on file  Housing: Not on file  Utilities: Not on file  Health Literacy: Not on file    Medications Ordered Prior to Encounter[1]  Allergies[2]   Review of Systems Constitutional: negative for chills, fatigue, fevers and sweats Eyes: negative for irritation, redness and visual disturbance Ears,  nose, mouth, throat, and face: negative for hearing loss, nasal congestion, snoring and tinnitus Respiratory: negative for asthma, cough, sputum Cardiovascular: negative for chest pain, dyspnea, exertional chest pressure/discomfort, irregular heart beat, palpitations and syncope Gastrointestinal: negative for abdominal pain, change in bowel habits, nausea and vomiting Genitourinary: negative for abnormal menstrual periods, genital lesions, sexual problems and vaginal discharge, dysuria and urinary incontinence Integument/breast: negative for breast lump, breast tenderness and nipple discharge Hematologic/lymphatic: negative for bleeding and easy bruising Musculoskeletal:negative for back pain and muscle weakness Neurological: negative for dizziness, headaches, vertigo and weakness Endocrine: negative for diabetic symptoms including polydipsia, polyuria and skin dryness Allergic/Immunologic: negative for hay fever and urticaria      Objective:  Blood pressure 114/72, pulse 75, height 5' 3 (1.6 m), weight 147 lb (66.7 kg), last menstrual period 11/18/2024. Body mass index is 26.04 kg/m.    General Appearance:    Alert, cooperative, no distress, appears stated age  Head:    Normocephalic, without obvious abnormality, atraumatic  Eyes:    PERRL, conjunctiva/corneas clear, EOM's intact, both eyes  Ears:    Normal external ear canals, both ears  Nose:   Nares normal, septum midline, mucosa normal, no drainage or sinus tenderness  Throat:   Lips, mucosa, and tongue normal; teeth and gums normal  Neck:   Supple, symmetrical, trachea midline, no adenopathy; thyroid: no enlargement/tenderness/nodules; no carotid bruit or JVD  Back:     Symmetric, no curvature, ROM normal, no CVA tenderness  Lungs:     Clear to auscultation bilaterally, respirations unlabored  Chest Wall:    No tenderness or deformity   Heart:    Regular rate and rhythm, S1 and S2 normal, no murmur, rub or gallop  Breast Exam:     No tenderness, masses, or nipple abnormality  Abdomen:     Soft, non-tender, bowel sounds active all four quadrants, no masses, no organomegaly.    Genitalia:    Pelvic:external genitalia normal, vagina without lesions, discharge, or tenderness,  Cervix normal in appearance, no cervical motion tenderness, no adnexal masses or tenderness.  Uterus normal size, shape, mobile, regular contours, nontender.     Extremities:   Extremities normal, atraumatic, no cyanosis or edema  Pulses:   2+ and symmetric all extremities  Skin:   Skin color, texture, turgor normal, no rashes or lesions  Lymph nodes:   Cervical, supraclavicular, and axillary nodes normal  Neurologic:   CNII-XII intact, normal strength, sensation and reflexes throughout   .  Labs:  Lab Results  Component Value Date   WBC 5.8 08/13/2024   HGB 13.6 08/13/2024   HCT 40.3 08/13/2024   MCV  85.7 08/13/2024   PLT 286 08/13/2024    Lab Results  Component Value Date   CREATININE 0.64 08/13/2024   BUN 15 08/13/2024   NA 136 08/13/2024   K 3.8 08/13/2024   CL 106 08/13/2024   CO2 22 08/13/2024    Lab Results  Component Value Date   ALT 25 08/13/2024   AST 21 08/13/2024   ALKPHOS 73 08/13/2024   BILITOT 0.3 08/13/2024    No results found for: TSH   Assessment:   1. Well woman exam with routine gynecological exam   2. 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 Cervical cancer screening   4. Preventative health care      Plan:  Blood tests: preventative health care labs. Breast self exam technique reviewed and patient encouraged to perform self-exam monthly. Contraception: OCP (estrogen/progesterone). Discussed healthy lifestyle modifications. Pap smear ordered. Aptima sent Follow up in 1 year for annual exam   Starla Harland BROCKS, MD [facility]     [1]  Current Outpatient Medications on File Prior to Visit  Medication Sig Dispense Refill   albuterol  (VENTOLIN  HFA) 108 (90  Base) MCG/ACT inhaler Inhale 2 puffs into the lungs every 6 (six) hours as needed for wheezing or shortness of breath. 8 g 2   escitalopram (LEXAPRO) 20 MG tablet Take 20 mg by mouth at bedtime.     Norgestimate-Ethinyl Estradiol Triphasic 0.18/0.215/0.25 MG-35 MCG tablet Take 1 tablet by mouth daily. 84 tablet 0   valACYclovir  (VALTREX ) 1000 MG tablet Take 1 tablet (1,000 mg total) by mouth daily. 90 tablet 3   fluconazole  (DIFLUCAN ) 150 MG tablet Take 1 tablet once weekly for 6 months. (Patient not taking: Reported on 11/18/2024) 24 tablet 0   Current Facility-Administered Medications on File Prior to Visit  Medication Dose Route Frequency Provider Last Rate Last Admin   cefTRIAXone  (ROCEPHIN ) injection 500 mg  500 mg Intramuscular Once Roby, Micia, MD      [2] No Known Allergies  "

## 2024-11-19 LAB — TSH: TSH: 1.64 u[IU]/mL (ref 0.450–4.500)

## 2024-11-19 LAB — LIPID PANEL
Chol/HDL Ratio: 3.6 ratio (ref 0.0–4.4)
Cholesterol, Total: 192 mg/dL (ref 100–199)
HDL: 54 mg/dL
LDL Chol Calc (NIH): 120 mg/dL — ABNORMAL HIGH (ref 0–99)
Triglycerides: 100 mg/dL (ref 0–149)
VLDL Cholesterol Cal: 18 mg/dL (ref 5–40)

## 2024-11-19 LAB — HEMOGLOBIN A1C
Est. average glucose Bld gHb Est-mCnc: 100 mg/dL
Hgb A1c MFr Bld: 5.1 % (ref 4.8–5.6)

## 2024-11-20 LAB — CERVICOVAGINAL ANCILLARY ONLY
Bacterial Vaginitis (gardnerella): NEGATIVE
Candida Glabrata: NEGATIVE
Candida Vaginitis: NEGATIVE
Comment: NEGATIVE
Comment: NEGATIVE
Comment: NEGATIVE
Comment: NEGATIVE
Trichomonas: NEGATIVE

## 2024-11-21 LAB — CYTOLOGY - PAP
Chlamydia: NEGATIVE
Comment: NEGATIVE
Comment: NORMAL
Diagnosis: NEGATIVE
Diagnosis: REACTIVE
Neisseria Gonorrhea: NEGATIVE

## 2024-11-25 ENCOUNTER — Encounter: Payer: Self-pay | Admitting: Obstetrics & Gynecology

## 2024-12-02 ENCOUNTER — Ambulatory Visit
Admission: EM | Admit: 2024-12-02 | Discharge: 2024-12-02 | Disposition: A | Discharge status: Home / Self Care | Attending: Physician Assistant | Admitting: Physician Assistant

## 2024-12-02 DIAGNOSIS — J069 Acute upper respiratory infection, unspecified: Secondary | ICD-10-CM

## 2024-12-02 DIAGNOSIS — R051 Acute cough: Secondary | ICD-10-CM

## 2024-12-02 DIAGNOSIS — J029 Acute pharyngitis, unspecified: Secondary | ICD-10-CM

## 2024-12-02 LAB — POCT RAPID STREP A (OFFICE): Rapid Strep A Screen: NEGATIVE

## 2024-12-02 MED ORDER — LIDOCAINE VISCOUS HCL 2 % MT SOLN: 15.0000 mL | OROMUCOSAL | 0 refills | Status: AC | PRN

## 2024-12-02 MED ORDER — PROMETHAZINE-DM 6.25-15 MG/5ML PO SYRP: 5.0000 mL | ORAL_SOLUTION | Freq: Four times a day (QID) | ORAL | 0 refills | Status: AC | PRN

## 2024-12-02 NOTE — ED Provider Notes (Signed)
 " [facility]    CSN: [id number] Arrival date & time: 12/02/24  1344      History   Chief Complaint Chief Complaint  Patient presents with   Sore Throat    HPI Robin Mccall is a 29 y.o. female presenting for 1 week history of headache, sore throat, cough and congestion.  She says it is going on my second week of illness.  Denies fever, ear pain, sinus pain, chest pain, wheezing or shortness of breath.  Her boyfriend was sick with similar symptoms recently and was diagnosed with pharyngitis and put on antibiotics.  She has been taking over-the-counter cold medication and using throat spray without relief.  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 11/18/2024   Left genital labial abscess 07/23/2024   Cellulitis of perineum 07/23/2024   Acute appendicitis with localized peritonitis, without perforation, abscess, or gangrene 06/07/2023   Bacterial vaginosis 04/14/2020   Microscopic hematuria 02/12/2016   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   Dyspareunia in female 02/12/2016   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    Past Surgical History:  Procedure Laterality Date   BREAST BIOPSY Right 10/26/2023   US  RT BREAST BX W LOC DEV 1ST LESION IMG BX SPEC US  GUIDE 10/26/2023 [facility]   DIAGNOSTIC LAPAROSCOPY     GANGLION CYST EXCISION Left 12/14/2015   Procedure: REMOVAL GANGLION OF WRIST;  Surgeon: Kayla Pinal, MD;  Location: [facility];  Service: Orthopedics;  Laterality: Left;    LAPAROSCOPIC APPENDECTOMY N/A 06/07/2023   Procedure: APPENDECTOMY LAPAROSCOPIC;  Surgeon: Lane Shope, MD;  Location: [facility];  Service: General;  Laterality: N/A;   laporoscopic abdomen      OB History     Gravida  0   Para  0   Term  0   Preterm  0   AB  0   Living  0      SAB  0   IAB  0   Ectopic  0   Multiple  0   Live Births  0            Home Medications    Prior to Admission medications  Medication Sig Start Date End Date Taking? Authorizing Provider  lidocaine  (XYLOCAINE ) 2 % solution Use as directed 15 mLs in the mouth or throat every 3 (three) hours as needed for mouth pain (swish and spit). 12/02/24  Yes Arvis Jolan NOVAK, PA-C  albuterol  (VENTOLIN  HFA) 108 (90 Base) MCG/ACT inhaler Inhale 2 puffs into the lungs every 6 (six) hours as needed for wheezing or shortness of breath. 09/25/23   Copland, Alicia B, PA-C  escitalopram (LEXAPRO) 20 MG tablet Take 20 mg by mouth at bedtime. 06/28/24   [provider]  fluconazole  (DIFLUCAN ) 150 MG tablet Take 1 tablet once weekly for 6 months. Patient not taking: No sig reported 07/18/24   Slaughterbeck, Damien,  CNM  Norgestimate-Ethinyl Estradiol Triphasic 0.18/0.215/0.25 MG-35 MCG tablet Take 1 tablet by mouth daily. 10/22/24   Dove, Myra C, MD  promethazine -dextromethorphan (PROMETHAZINE -DM) 6.25-15 MG/5ML syrup Take 5 mLs by mouth 4 (four) times daily as needed. 12/02/24   Arvis Jolan NOVAK, PA-C  valACYclovir  (VALTREX ) 1000 MG tablet Take 1 tablet (1,000 mg total) by mouth daily. 07/18/24   Slaughterbeck, Damien, CNM    Family History Family History  Problem Relation Age of Onset   Stroke Maternal Aunt    Bone cancer Maternal Grandfather    Stomach cancer Maternal Great-grandfather    Kidney disease Neg Hx    Bladder Cancer Neg Hx     Social History Social History[1]   Allergies   Patient has no known allergies.   Review of Systems Review of Systems  Constitutional:  Positive for  fatigue. Negative for chills, diaphoresis and fever.  HENT:  Positive for congestion, rhinorrhea and sore throat. Negative for ear pain, sinus pressure, sinus pain and trouble swallowing.   Respiratory:  Positive for cough. Negative for shortness of breath.   Cardiovascular:  Negative for chest pain.  Gastrointestinal:  Negative for abdominal pain, nausea and vomiting.  Musculoskeletal:  Negative for myalgias.  Skin:  Negative for rash.  Neurological:  Positive for headaches. Negative for weakness.  Hematological:  Negative for adenopathy.     Physical Exam Triage Vital Signs ED Triage Vitals  Encounter Vitals Group     BP 12/02/24 1356 115/78     Girls Systolic BP Percentile --      Girls Diastolic BP Percentile --      Boys Systolic BP Percentile --      Boys Diastolic BP Percentile --      Pulse --      Resp 12/02/24 1356 16     Temp 12/02/24 1356 98.9 F (37.2 C)     Temp Source 12/02/24 1356 Oral     SpO2 12/02/24 1356 96 %     Weight --      Height --      Head Circumference --      Peak Flow --      Pain Score 12/02/24 1354 5     Pain Loc --      Pain Education --      Exclude from Growth Chart --    No data found.  Updated Vital Signs BP 115/78 (BP Location: Right Arm)   Pulse 84   Temp 98.9 F (37.2 C) (Oral)   Resp 16   LMP 11/18/2024 (Exact Date)   SpO2 96%       Physical Exam Vitals and nursing note reviewed.  Constitutional:      General: She is not in acute distress.    Appearance: Normal appearance. She is well-developed. She is not ill-appearing or toxic-appearing.  HENT:     Head: Normocephalic and atraumatic.     Right Ear: Tympanic membrane, ear canal and external ear normal.     Left Ear: Tympanic membrane, ear canal and external ear normal.     Nose: Congestion present.     Mouth/Throat:     Mouth: Mucous membranes are moist.     Pharynx: Oropharynx is clear. Posterior oropharyngeal erythema present.  Eyes:     General: No scleral  icterus.       Right eye: No discharge.        Left eye: No discharge.     Conjunctiva/sclera: Conjunctivae normal.  Cardiovascular:  Rate and Rhythm: Normal rate and regular rhythm.     Heart sounds: Normal heart sounds.  Pulmonary:     Effort: Pulmonary effort is normal. No respiratory distress.     Breath sounds: Normal breath sounds.  Musculoskeletal:     Cervical back: Neck supple.  Skin:    General: Skin is dry.  Neurological:     General: No focal deficit present.     Mental Status: She is alert. Mental status is at baseline.     Motor: No weakness.     Gait: Gait normal.  Psychiatric:        Mood and Affect: Mood normal.        Behavior: Behavior normal.      UC Treatments / Results  Labs (all labs ordered are listed, but only abnormal results are displayed) Labs Reviewed  POCT RAPID STREP A (OFFICE) - Normal  CULTURE, GROUP A STREP [facility])    EKG   Radiology No results found.  Procedures Procedures (including critical care time)  Medications Ordered in UC Medications - No data to display  Initial Impression / Assessment and Plan / UC Course  I have reviewed the triage vital signs and the nursing notes.  Pertinent labs & imaging results that were available during my care of the patient were reviewed by me and considered in my medical decision making (see chart for details).   29 year old female presents for 1 week history of sore throat, headaches, fatigue, cough and congestion.  Boyfriend recently sick with similar symptoms.  Rapid strep negative.  Will send for culture and treat with antibiotics if positive.  Viral URI.  Supportive care encouraged with increasing rest and fluids.  Sent Promethazine  DM and viscous lidocaine  to pharmacy.  Reviewed typical course of most viral illnesses and discussed return precautions.   Final Clinical Impressions(s) / UC Diagnoses   Final diagnoses:  Sore throat  Viral upper respiratory tract infection  Acute  cough     Discharge Instructions      - Rapid strep is negative.  Sending swab for culture and if culture is positive we will call and send antibiotics. - Symptoms consistent with viral illness unless we contact you and send antibiotics. - I sent cough medicine and viscous lidocaine .  Treatment is supportive.  Increase rest and fluids. - Return if feverish or symptoms worsen.     ED Prescriptions     Medication Sig Dispense Auth. Provider   promethazine -dextromethorphan (PROMETHAZINE -DM) 6.25-15 MG/5ML syrup Take 5 mLs by mouth 4 (four) times daily as needed. 118 mL Arvis Huxley B, PA-C   lidocaine  (XYLOCAINE ) 2 % solution Use as directed 15 mLs in the mouth or throat every 3 (three) hours as needed for mouth pain (swish and spit). 100 mL Arvis Huxley NOVAK, PA-C      PDMP not reviewed this encounter.     [1]  Social History Tobacco Use   Smoking status: Never   Smokeless tobacco: Never  Vaping Use   Vaping status: Never Used  Substance Use Topics   Alcohol use: Not Currently   Drug use: No     Arvis Huxley NOVAK, PA-C 12/02/24 1425  "

## 2024-12-02 NOTE — Discharge Instructions (Signed)
-   Rapid strep is negative.  Sending swab for culture and if culture is positive we will call and send antibiotics. - Symptoms consistent with viral illness unless we contact you and send antibiotics. - I sent cough medicine and viscous lidocaine .  Treatment is supportive.  Increase rest and fluids. - Return if feverish or symptoms worsen.

## 2024-12-02 NOTE — ED Triage Notes (Signed)
 Patient presents to UC for sore throat and HA x 2 weeks. BF came in today and was prescribed antibiotics. She was sick first so thinks she gave him what he had. Came in for eval. Treating with throat spray, cold/flu, nasal spray with no improvement.

## 2024-12-13 ENCOUNTER — Other Ambulatory Visit: Payer: Self-pay | Admitting: Certified Nurse Midwife

## 2024-12-13 DIAGNOSIS — N898 Other specified noninflammatory disorders of vagina: Secondary | ICD-10-CM
